# Patient Record
Sex: Male | Born: 1975 | Race: White | Hispanic: No | Marital: Single
Health system: Southern US, Community
[De-identification: ages and names within clinical notes are randomized; demographics above are authoritative.]

## PROBLEM LIST (undated history)

## (undated) DIAGNOSIS — K5010 Crohn's disease of large intestine without complications: Secondary | ICD-10-CM

## (undated) HISTORY — DX: Crohn's disease of large intestine without complications: K50.10

---

## 2009-01-31 ENCOUNTER — Emergency Department: Payer: Self-pay | Admitting: Emergency Medicine

## 2009-12-07 ENCOUNTER — Emergency Department: Payer: Self-pay | Admitting: Emergency Medicine

## 2012-06-13 ENCOUNTER — Emergency Department: Payer: Self-pay | Admitting: Emergency Medicine

## 2012-08-18 ENCOUNTER — Inpatient Hospital Stay: Payer: Self-pay | Admitting: Surgery

## 2012-08-18 LAB — COMPREHENSIVE METABOLIC PANEL
Albumin: 3.8 g/dL (ref 3.4–5.0)
Bilirubin,Total: 0.3 mg/dL (ref 0.2–1.0)
Chloride: 106 mmol/L (ref 98–107)
Co2: 30 mmol/L (ref 21–32)
Creatinine: 1.09 mg/dL (ref 0.60–1.30)
EGFR (African American): >60
EGFR (Non-African Amer.): >60
Glucose: 101 mg/dL — ABNORMAL HIGH (ref 65–99)
Osmolality: 283 (ref 275–301)
SGOT(AST): 23 U/L (ref 15–37)
SGPT (ALT): 19 U/L (ref 12–78)
Sodium: 142 mmol/L (ref 136–145)
Total Protein: 7.5 g/dL (ref 6.4–8.2)

## 2012-08-18 LAB — URINALYSIS, COMPLETE
Bilirubin,UR: NEGATIVE
Glucose,UR: NEGATIVE mg/dL (ref 0–75)
Ketone: NEGATIVE
Leukocyte Esterase: NEGATIVE
Ph: 6 (ref 4.5–8.0)
RBC,UR: 3 /HPF (ref 0–5)
Squamous Epithelial: <1
WBC UR: 2 /HPF (ref 0–5)

## 2012-08-18 LAB — CBC
HCT: 47.6 % (ref 40.0–52.0)
HGB: 16.1 g/dL (ref 13.0–18.0)
MCH: 30.8 pg (ref 26.0–34.0)
MCV: 91 fL (ref 80–100)
Platelet: 311 10*3/uL (ref 150–440)
RDW: 12.6 % (ref 11.5–14.5)
WBC: 19.3 10*3/uL — ABNORMAL HIGH (ref 3.8–10.6)

## 2012-08-20 LAB — BASIC METABOLIC PANEL
Calcium, Total: 8.1 mg/dL — ABNORMAL LOW (ref 8.5–10.1)
EGFR (Non-African Amer.): >60
Glucose: 105 mg/dL — ABNORMAL HIGH (ref 65–99)
Osmolality: 281 (ref 275–301)
Potassium: 3.7 mmol/L (ref 3.5–5.1)
Sodium: 142 mmol/L (ref 136–145)

## 2012-08-20 LAB — CBC WITH DIFFERENTIAL/PLATELET
Eosinophil #: 0.1 10*3/uL (ref 0.0–0.7)
Eosinophil %: 1.3 %
HCT: 37.7 % — ABNORMAL LOW (ref 40.0–52.0)
HGB: 12.8 g/dL — ABNORMAL LOW (ref 13.0–18.0)
Lymphocyte #: 0.8 10*3/uL — ABNORMAL LOW (ref 1.0–3.6)
MCH: 31.2 pg (ref 26.0–34.0)
Monocyte #: 0.5 x10 3/mm (ref 0.2–1.0)
Monocyte %: 4.7 %
Neutrophil #: 9 10*3/uL — ABNORMAL HIGH (ref 1.4–6.5)
Neutrophil %: 86.2 %
Platelet: 242 10*3/uL (ref 150–440)
RBC: 4.11 10*6/uL — ABNORMAL LOW (ref 4.40–5.90)
WBC: 10.5 10*3/uL (ref 3.8–10.6)

## 2012-08-21 LAB — BASIC METABOLIC PANEL
Anion Gap: 6 — ABNORMAL LOW (ref 7–16)
BUN: 5 mg/dL — ABNORMAL LOW (ref 7–18)
Chloride: 104 mmol/L (ref 98–107)
Creatinine: 1.08 mg/dL (ref 0.60–1.30)
Potassium: 3.6 mmol/L (ref 3.5–5.1)
Sodium: 141 mmol/L (ref 136–145)

## 2012-08-21 LAB — CBC WITH DIFFERENTIAL/PLATELET
Basophil #: 0 10*3/uL (ref 0.0–0.1)
Eosinophil #: 0.2 10*3/uL (ref 0.0–0.7)
Eosinophil %: 2.8 %
HCT: 39.2 % — ABNORMAL LOW (ref 40.0–52.0)
Lymphocyte %: 14 %
MCH: 30.3 pg (ref 26.0–34.0)
MCHC: 33.5 g/dL (ref 32.0–36.0)
MCV: 91 fL (ref 80–100)
Neutrophil #: 5.8 10*3/uL (ref 1.4–6.5)
Neutrophil %: 77.8 %
RDW: 12.7 % (ref 11.5–14.5)
WBC: 7.4 10*3/uL (ref 3.8–10.6)

## 2012-08-22 LAB — CBC WITH DIFFERENTIAL/PLATELET
Basophil %: 0.3 %
Eosinophil %: 2.5 %
Lymphocyte %: 11.2 %
Monocyte #: 0.5 x10 3/mm (ref 0.2–1.0)
Monocyte %: 4.6 %
Neutrophil %: 81.4 %
Platelet: 319 10*3/uL (ref 150–440)
RBC: 4.86 10*6/uL (ref 4.40–5.90)
WBC: 10.9 10*3/uL — ABNORMAL HIGH (ref 3.8–10.6)

## 2012-08-22 LAB — BASIC METABOLIC PANEL
Anion Gap: 9 (ref 7–16)
Co2: 27 mmol/L (ref 21–32)
Creatinine: 0.85 mg/dL (ref 0.60–1.30)
EGFR (African American): >60
Potassium: 3.5 mmol/L (ref 3.5–5.1)
Sodium: 141 mmol/L (ref 136–145)

## 2012-08-23 LAB — WOUND CULTURE

## 2012-08-24 LAB — CBC WITH DIFFERENTIAL/PLATELET
Basophil #: 0 10*3/uL (ref 0.0–0.1)
Basophil %: 0.4 %
Eosinophil %: 4.9 %
HCT: 34.4 % — ABNORMAL LOW (ref 40.0–52.0)
Lymphocyte #: 1.1 10*3/uL (ref 1.0–3.6)
MCH: 31 pg (ref 26.0–34.0)
MCV: 90 fL (ref 80–100)
Monocyte #: 0.4 x10 3/mm (ref 0.2–1.0)
Monocyte %: 5.9 %
Neutrophil #: 4.9 10*3/uL (ref 1.4–6.5)
Platelet: 273 10*3/uL (ref 150–440)
RBC: 3.81 10*6/uL — ABNORMAL LOW (ref 4.40–5.90)
WBC: 6.7 10*3/uL (ref 3.8–10.6)

## 2012-08-24 LAB — COMPREHENSIVE METABOLIC PANEL
Albumin: 2.4 g/dL — ABNORMAL LOW (ref 3.4–5.0)
Anion Gap: 7 (ref 7–16)
BUN: 8 mg/dL (ref 7–18)
Bilirubin,Total: 0.3 mg/dL (ref 0.2–1.0)
Co2: 26 mmol/L (ref 21–32)
EGFR (Non-African Amer.): >60
Osmolality: 281 (ref 275–301)
Potassium: 3.8 mmol/L (ref 3.5–5.1)
Sodium: 142 mmol/L (ref 136–145)
Total Protein: 5.3 g/dL — ABNORMAL LOW (ref 6.4–8.2)

## 2012-08-25 LAB — CULTURE, BLOOD (SINGLE)

## 2013-11-12 IMAGING — CT CT ABD-PELV W/ CM
1 of 2 series · 15 of 32 positions shown, 19 images · IV contrast (isovue)
Comparison: none

REASON FOR EXAM: (1) RLQ PAIN; (2) RLQ PAIN, EVAL APPENDICITIS;    NOTE:
Nursing to Give Oral CT
COMMENTS:   May transport without cardiac monitor

PROCEDURE:     CT  - CT ABDOMEN / PELVIS  W  - August 18, 2012 [DATE]
RESULT:     Comparison:  None
TECHNIQUE: Multiple axial images of the abdomen and pelvis were performed
from the lung bases to the pubic symphysis, with p.o. contrast and with 100
mL of Isovue 300 intravenous contrast.

[Series 2: 3mm soft tissue · axial · 0.76mm/px · z∈[-1124,-668]mm · 15 of 166 slices shown, 19 images]
[im 7/166  soft-tissue]
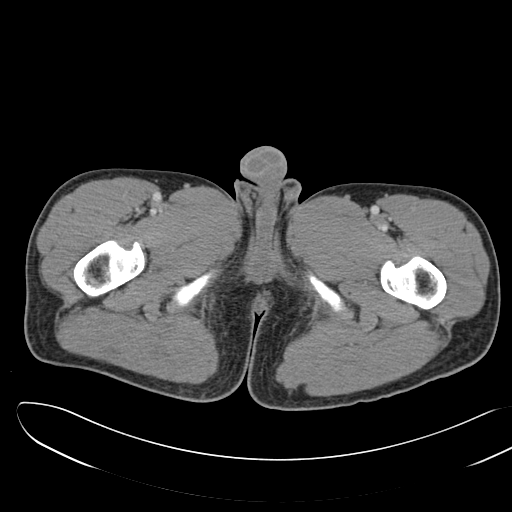
[im 7/166  bone]
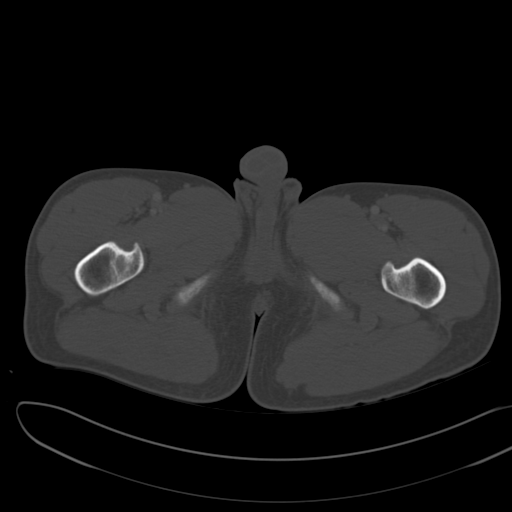
[im 20/166  soft-tissue]
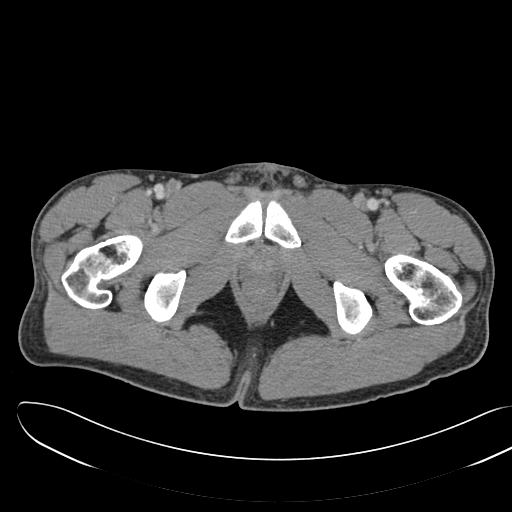
[im 32/166  soft-tissue]
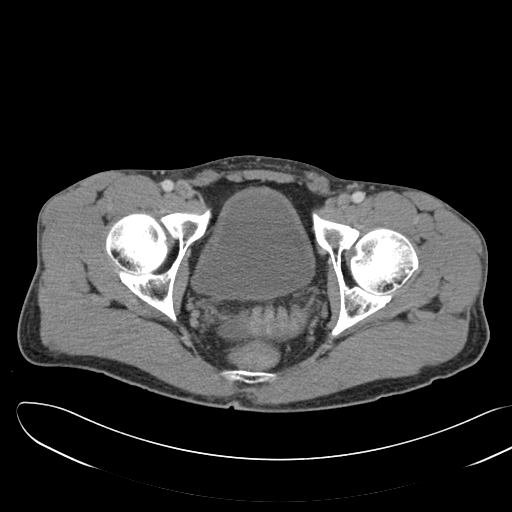
[im 45/166  soft-tissue]
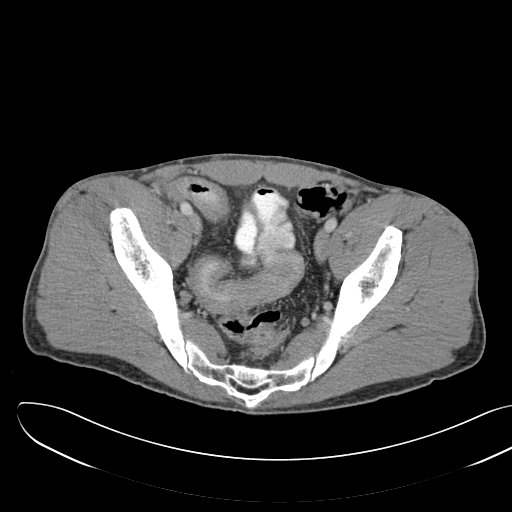
[im 58/166  soft-tissue]
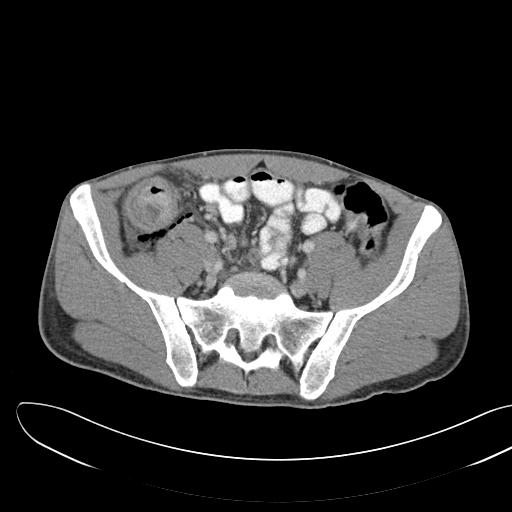
[im 70/166  soft-tissue]
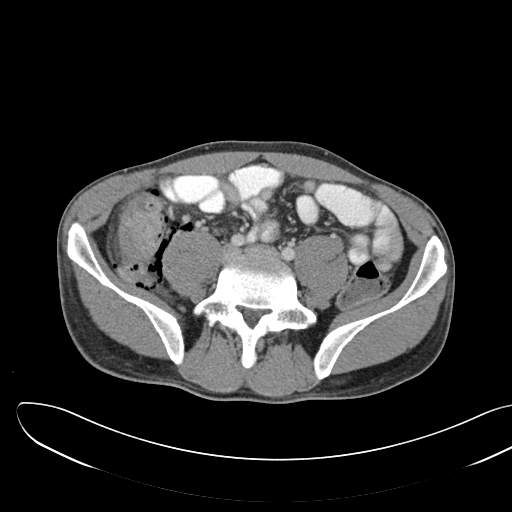
[im 83/166  soft-tissue]
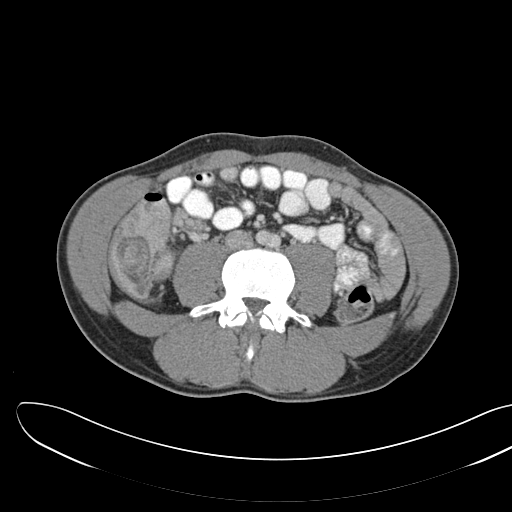
[im 96/166  soft-tissue]
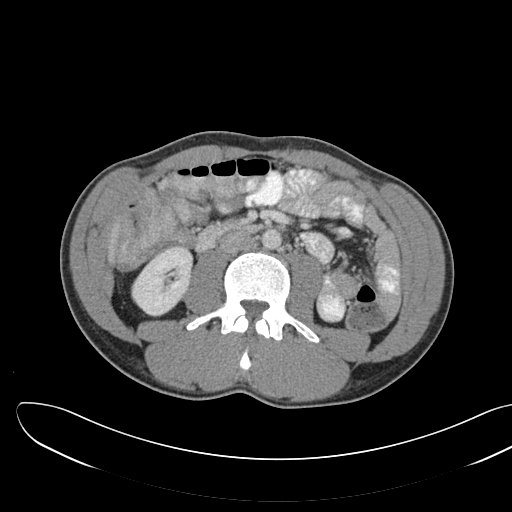
[im 108/166  soft-tissue]
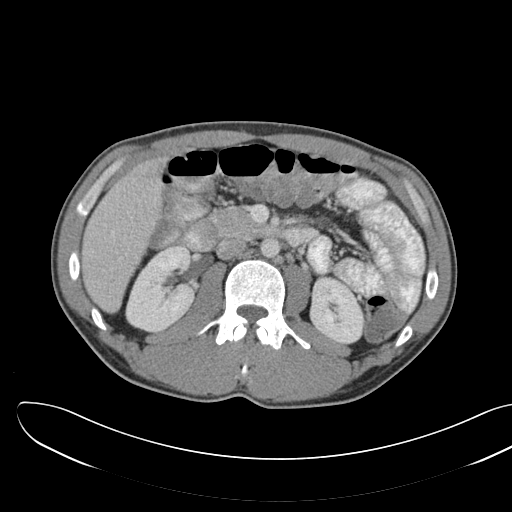
[im 108/166  bone]
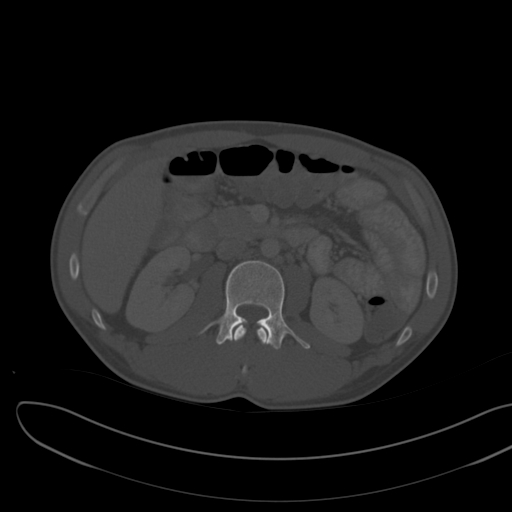
[im 121/166  soft-tissue]
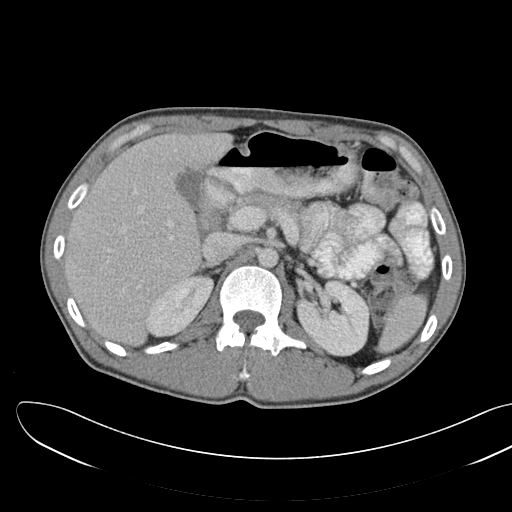
[im 134/166  soft-tissue]
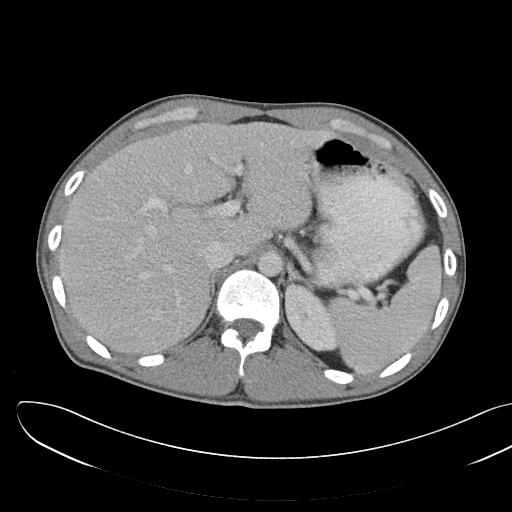
[im 140/166  lung]
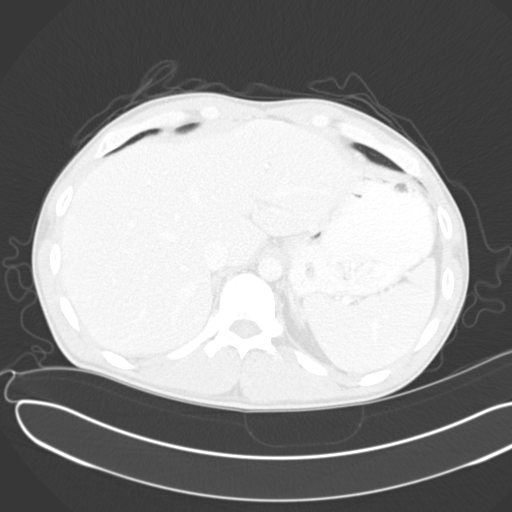
[im 146/166  soft-tissue]
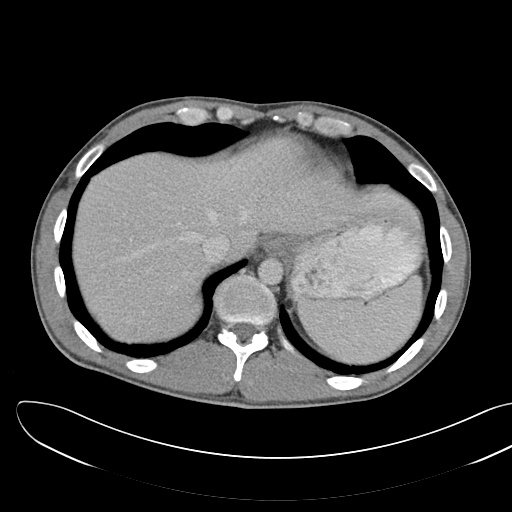
[im 146/166  lung]
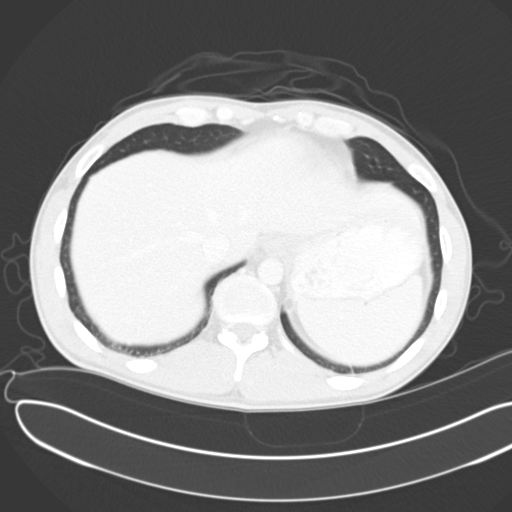
[im 153/166  lung]
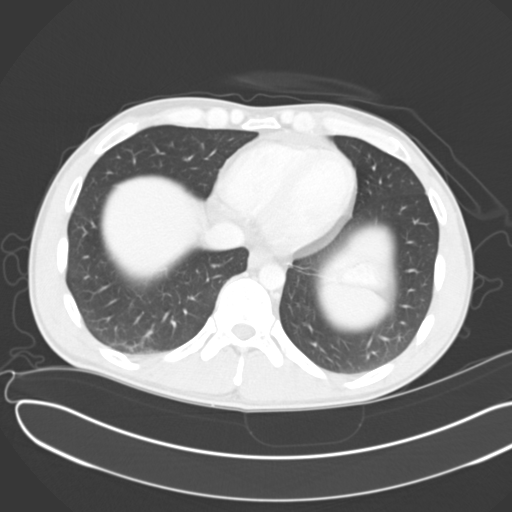
[im 159/166  soft-tissue]
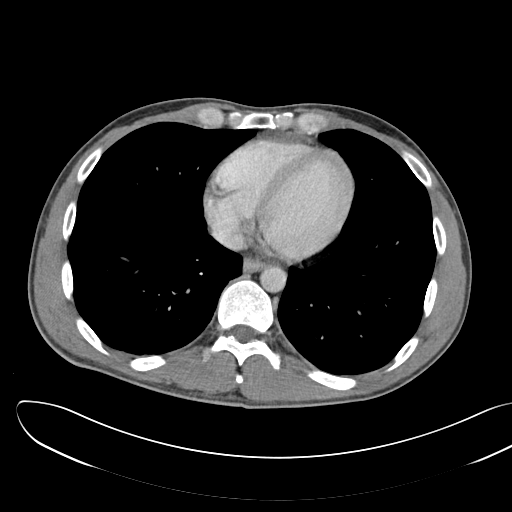
[im 159/166  lung]
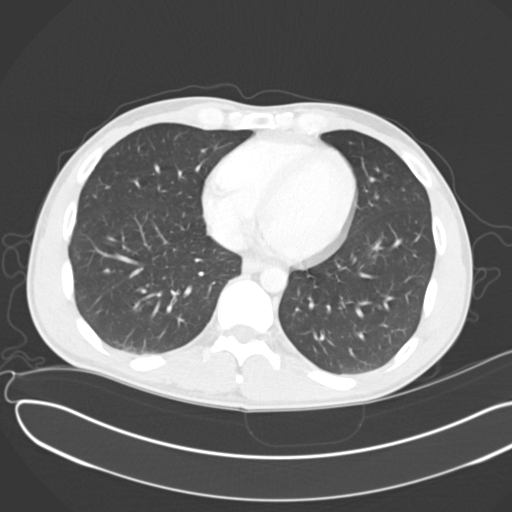

[15 of 32 positions shown; findings below may reference images not displayed]

FINDINGS: Mild basilar opacities likely represent atelectasis.

The liver, gallbladder, adrenals, spleen, and pancreas are unremarkable. The
kidneys enhance normally.

There is a trace amount of free fluid in the pelvis. There is marked bowel
wall thickening of the terminal ileum. There is some bowel thickening of the
cecum and ascending colon as well. There are multiple foci of extraluminal
air adjacent to this bowel wall thickening. There is a small diverticulum
versus contained perforation extending from the terminal ileum as seen on
image 105. A drainable fluid collection is not identified.

No aggressive lytic or sclerotic osseous lesions are identified.
IMPRESSION: 1. There is marked bowel wall thickening of the terminal ileum as well as
bowel thickening of the ascending colon and cecum. The findings are likely
infectious or inflammatory, such as Crohn's disease. There is adjacent free
intraperitoneal air, consistent with bowel perforation. There is a small
diverticulum versus contained perforation extending medially from the
terminal ileum.
2. The appendix cannot be identified and therefore, appendicitis cannot be
excluded. However, the findings in the right lower quadrant appear to be
centered about the wall thickening of the terminal ileum.

This was discussed with Dr. Calandra Jim at 2220 hours 08/18/2012.

[REDACTED]

## 2013-11-19 IMAGING — CT CT ABD-PELV W/ CM
1 of 2 series · 14 of 32 positions shown, 18 images · non-contrast
Comparison: none

REASON FOR EXAM: (1) POD 7, no bowel fxn; (2) POD 7, no bowel function
COMMENTS:

PROCEDURE:     CT  - CT ABDOMEN / PELVIS  W  - August 25, 2012  [DATE]
RESULT:
TECHNIQUE: CT of the abdomen and pelvis is performed with 100 ml of
Rsovue-4II iodinated intravenous contrast with images reconstructed at 3 mm
slice thickness in the axial plane and compared to a study dated 18 August, 2012.

[Series 2: soft tissue · axial · 0.71mm/px · z∈[-1075,-655]mm · 14 of 160 slices shown, 18 images]
[im 13/160  soft-tissue]
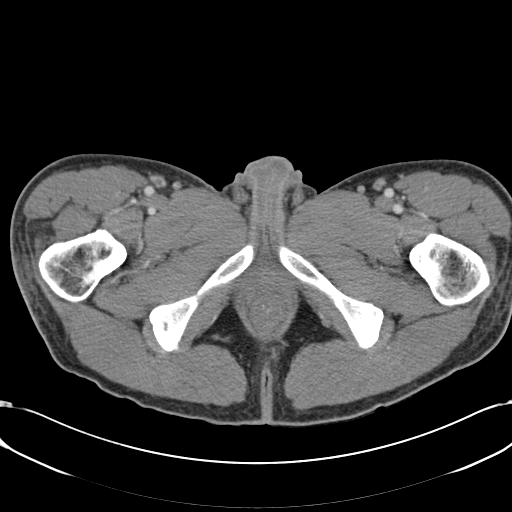
[im 13/160  bone]
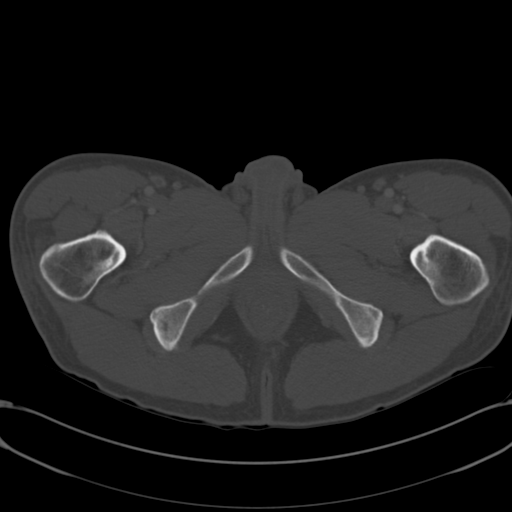
[im 25/160  soft-tissue]
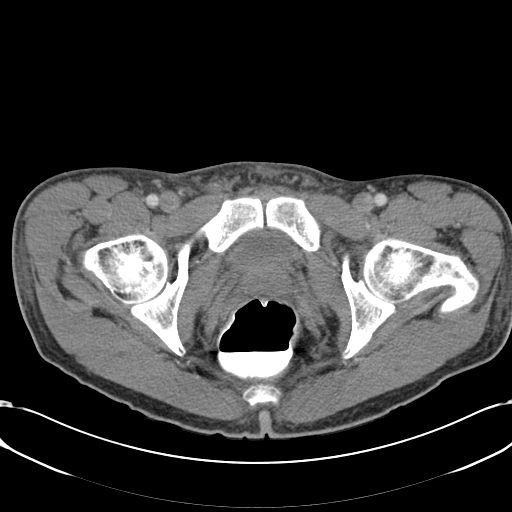
[im 37/160  soft-tissue]
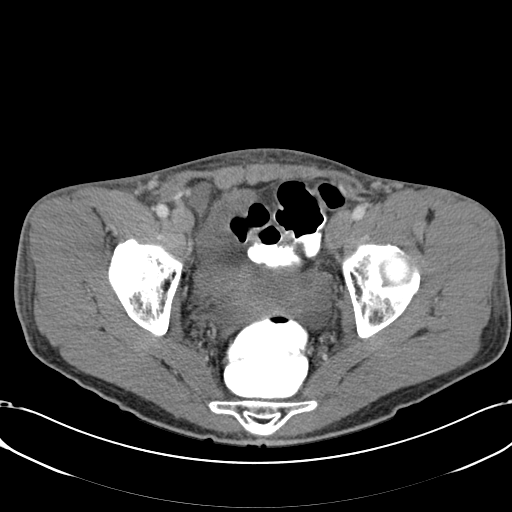
[im 49/160  soft-tissue]
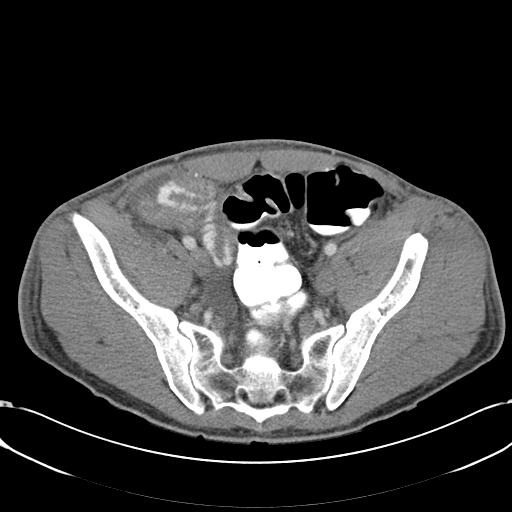
[im 62/160  soft-tissue]
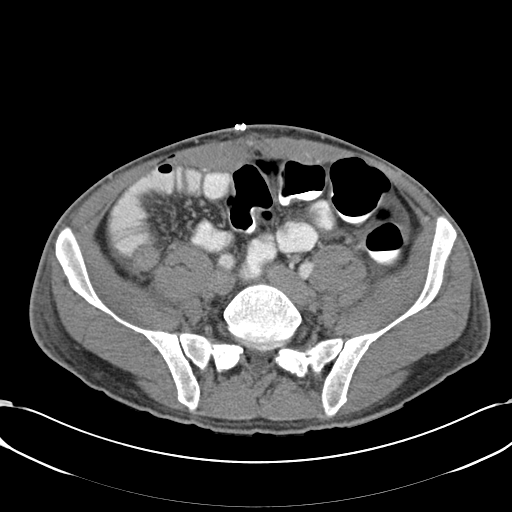
[im 74/160  soft-tissue]
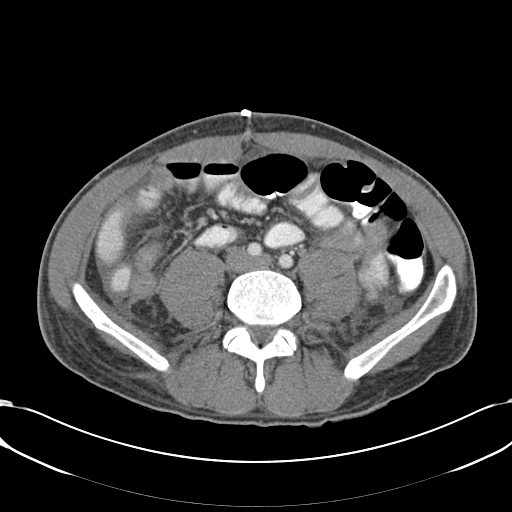
[im 86/160  soft-tissue]
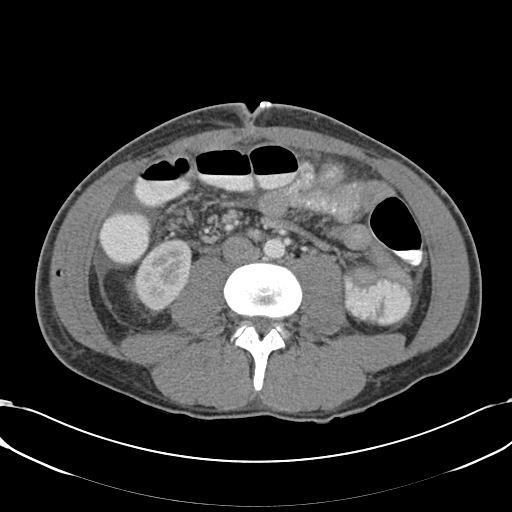
[im 98/160  soft-tissue]
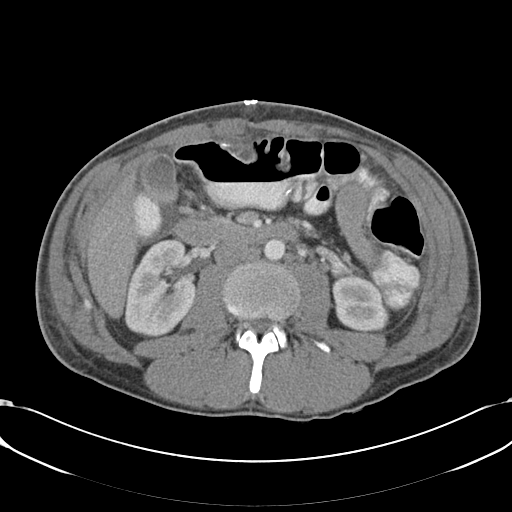
[im 111/160  soft-tissue]
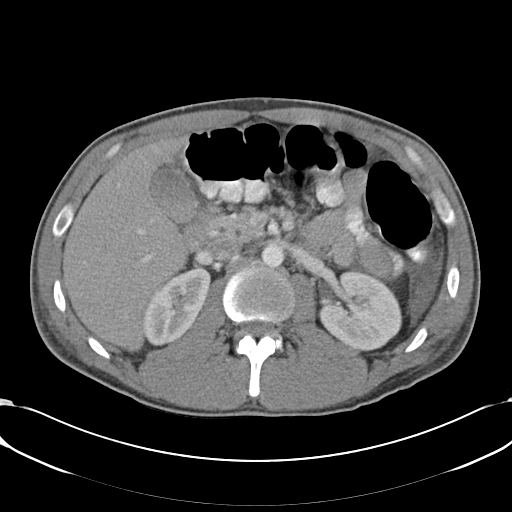
[im 111/160  bone]
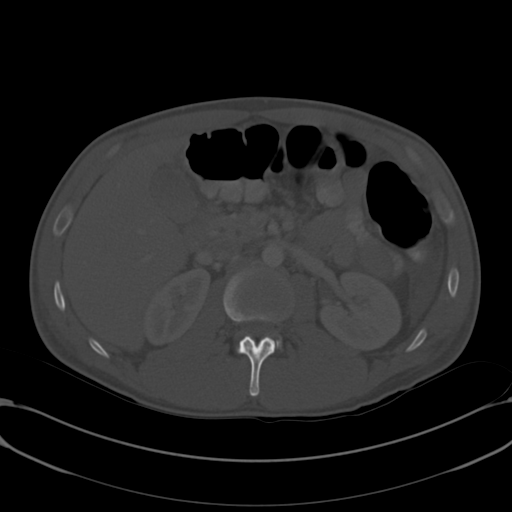
[im 123/160  soft-tissue]
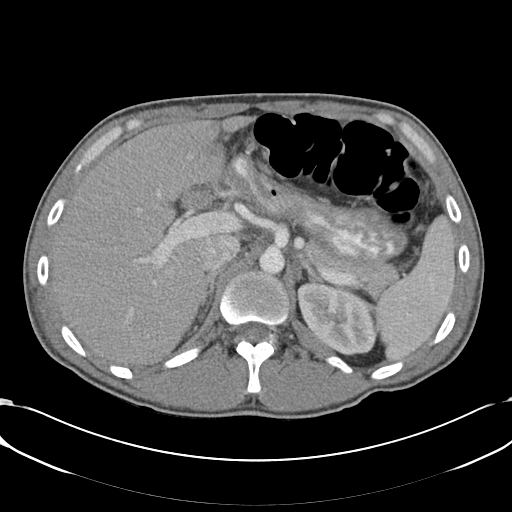
[im 135/160  soft-tissue]
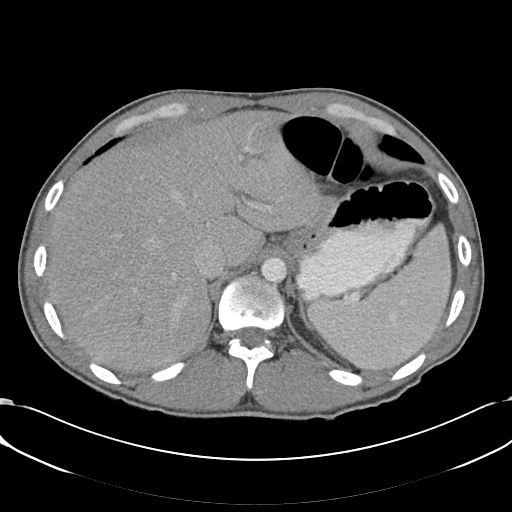
[im 135/160  lung]
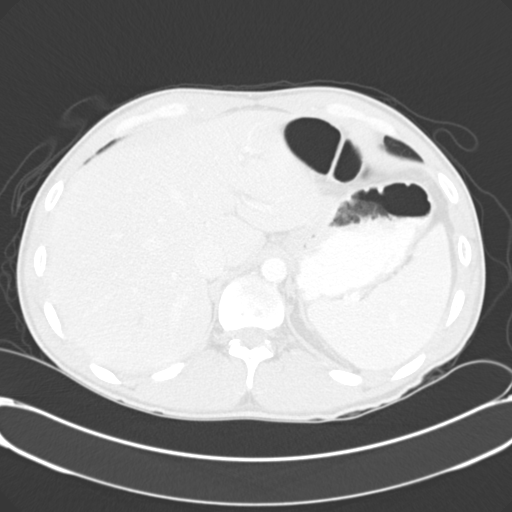
[im 141/160  lung]
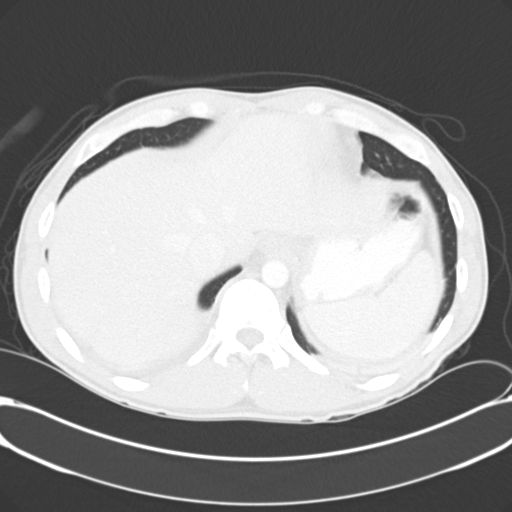
[im 147/160  soft-tissue]
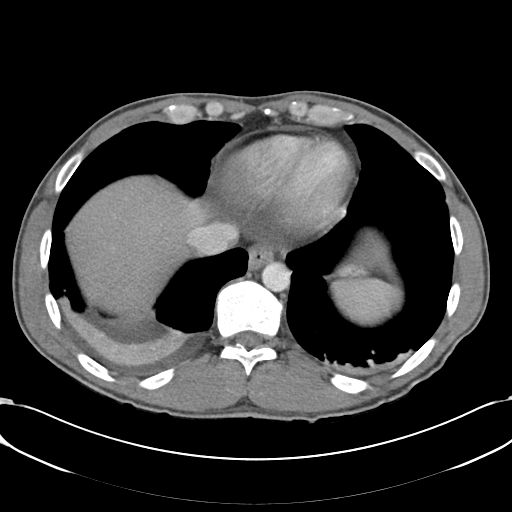
[im 147/160  lung]
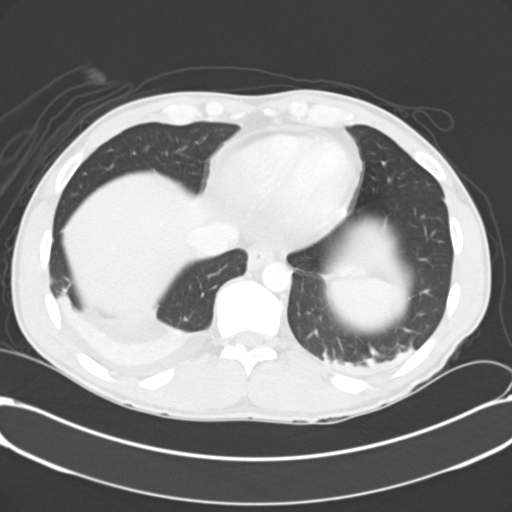
[im 153/160  lung]
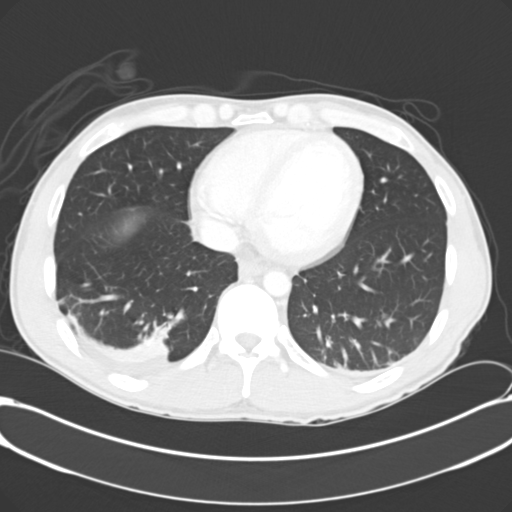

[14 of 32 positions shown; findings below may reference images not displayed]

FINDINGS: The study demonstrates development of a trace amount of right
pleural effusion since the previous study with some bilateral lower lobe
atelectasis versus infiltrate, larger on the right than the left. This is a
relatively minimal amount but does show progression since the previous study.

It appears the patient has undergone previous surgery since the previous
exam suggesting partial colectomy on the right. There are nonopacified loops
of small bowel without abnormal distention. It is difficult to assess the
wall of these loops. Oral contrast has passed into the colon and is present
in the rectum. There is no evidence of obstruction, abscess or perforation.
Some layering density in the gallbladder could represent sludge or
hyperdense bile. There is no intrahepatic biliary ductal dilation or hepatic
mass. The spleen is unremarkable. There is slight thickening of the wall of
the body of the stomach to the antrum which could be from incomplete
distention or mild gastritis. Correlate with symptoms. The aorta is normal
in caliber. The kidneys show no abnormal obstructive pattern or mass. The
adrenal glands are unremarkable. The bony structures appear within normal
limits.
IMPRESSION: 1.     Findings suggestive of interval hemicolectomy on the right without
evidence of obstruction or perforation. No abscess evident. Some unopacified
loops of small bowel are present. It is difficult to assess the wall
thickness in these loops. Oral contrast is seen in the colon to the rectum.
There may be mild thickening of the wall of the stomach or this could be
artifact secondary to poor distention.
2.     The patient has developed bibasilar areas of atelectasis or minimal
infiltrate, slightly greater on the right, with a trace, right pleural
effusion.

[REDACTED]

## 2015-02-27 NOTE — Op Note (Signed)
PATIENT NAME:  Matthew Madden, AESCHLIMAN MR#:  [id number] DATE OF BIRTH:  [birth year]  DATE OF PROCEDURE:  08/19/2012  PREOPERATIVE DIAGNOSIS: Inflammatory bowel disease right lower quadrant, possible abscess, possible perforation.   POSTOPERATIVE DIAGNOSIS: Crohn's disease abscess but no evidence of gross perforation.   SURGERY: Small bowel resection and ascending colon resection with primary anastomosis.   SURGEON: Quentin Ore, III, MD   ANESTHESIA: General.   OPERATIVE PROCEDURE: With the patient in the supine position after the induction of appropriate general anesthesia, the patient's abdomen was prepped with ChloraPrep and draped with sterile towels. A midline incision was made from just above the umbilicus to just below the umbilicus and carried down through the subcutaneous tissue using Bovie electrocautery. Midline fascia was identified and opened the length of the skin incision as was the peritoneum. Some purulent fluid was encountered immediately. The omentum appeared to be stuck in the right lower quadrant. The fluid was cultured. The bowel was mobilized and it was clearly an inflammatory bowel situation with creeping fat over the very thickened edematous distal ileum. The disease appeared to include the beginning of the colon. The appendix appeared to be normal. There was a large amount of purulent fluid in the right lower quadrant but I could not demonstrate a perforation. I carefully examined the bowel from the distal two feet of the small bowel to the mid transverse colon but did not identify any evidence of an obvious perforation. There was a diverticulum about 8 inches from the ileocecal valve. I felt that this area needed to be resected with only inflammatory change and probable Crohn's disease. The right colon was mobilized from the proximal transverse colon around the cecum and appendix. Small bowel was elevated into the incision and run from the ligament of Treitz. There did not appear to  be any other evidence of disease. The left lower quadrant was examined and did not appear to be any evidence of any diverticulitis or inflammatory change in the transverse or descending colon. The stomach was uninvolved. The liver and gallbladder appeared normal. An area was chosen on the small bowel approximately 10 cm proximal to the most distal area of inflammatory change. The bowel was divided with a GIA-75 stapling device. The ascending colon was divided at the hepatic flexure in a similar manner. The mesentery was taken down with the LigaSure apparatus providing a large remnant of mesentery to provide good blood flow to both the proximal and distal bowel. The bowel was passed off the table and opened. At the time of the evaluation what appeared to be about a 3 cm margin on the small bowel so I removed another 7 cm of small bowel with another division with the GIA stapling device and LigaSure apparatus dividing the mesentery. Two loops of bowel were placed side by side and small enterotomies were made in each loop of bowel. Another load for the GIA-75 stapling device was utilized to provide a side-to-side anastomosis. The anastomosis appeared to be satisfactory. The enterotomy was closed with a single application of a TX-60 device carrying a blue load. The staple line was oversewn with 3-0 silk in a seromuscular fashion. The mesenteric defect was closed with a running suture of 0 Vicryl. Bowel contents were returned to their anatomic position. The abdomen was irrigated with 2 liters of warm saline solution. Fascia was closed with running 0 Maxon suture from each end, tied in the middle, and the skin was stapled closed. Sterile dressing was applied.  The patient was returned to the recovery room having tolerated the procedure well. Sponge, instrument, and needle counts were correct x2 in the operating room.    ____________________________ Quentin Ore III, MD rle:drc D: 08/19/2012 01:42:02  ET T: 08/19/2012 07:16:33 ET JOB#: [id number]  cc: Quentin Ore III, MD, <Dictator> Quentin Ore MD ELECTRONICALLY SIGNED 08/20/2012 22:52

## 2015-02-27 NOTE — H&P (Signed)
PATIENT NAME:  Matthew Madden, Matthew Madden MR#:  [id number] DATE OF BIRTH:  03/12/1976  DATE OF ADMISSION:  08/18/2012  PRIMARY CARE PHYSICIAN: None ADMITTING PHYSICIAN: Dr. Michela Pitcher  CHIEF COMPLAINT: Abdominal pain, nausea and vomiting.   BRIEF HISTORY: Matthew Madden is a 39 year old gentleman with the sudden onset of abdominal pain early this morning. He had been eating and noted abdominal discomfort which he characterized as gas pain. Tried to have a bowel movement without any improvement in his symptoms and over the course of the next several hours the pain became much more severe. Began to have profound nausea and vomiting. His mother brought him to the hospital for further evaluation. He denies any similar previous problems in the past. He has lost weight recently. Denies any other GI symptoms with no diarrhea or constipation. He has had no previous GI work-up. Denies any history of hepatitis, pancreatitis, peptic ulcer disease, gallbladder disease, or diverticulitis. He does have history of yellow jaundice as a child. He has had bilateral inguinal hernia repairs as a child. He was a premature birth.   Work-up in the Emergency Room revealed a markedly elevated white blood cell count of 19,000. CT scan was performed with contrast which revealed free air in the right lower quadrant, right lower quadrant abscess, thickened small bowel suggestive of possible inflammatory bowel disease. Appendicitis could not be ruled out. The surgical service was consulted.   He denies any history of cardiac disease, hypertension, or diabetes. He has a history of thyroid disease.   MEDICATIONS: He takes no medications regularly.   ALLERGIES: He has no medical allergies.   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 He works as a Education administrator.   FAMILY HISTORY: Noncontributory.   REVIEW OF SYSTEMS: 10 point review of systems is carried out and is not significant for anything other than the symptoms noted  above.   PHYSICAL EXAMINATION:  GENERAL: Temperature 98.1, blood pressure 116/60, heart rate 98 and regular, oxygen saturation 98%. Pain is an 8 on a scale of 10.    HEENT: Sweating. He has no scleral icterus. No facial deformities. No pupillary abnormality.   NECK: Supple. No tenderness. No adenopathy. Midline trachea.   CHEST: Clear with no adventitious sounds. Normal pulmonary excursion.   CARDIAC: Normal sinus rhythm and no murmurs or gallops.   ABDOMEN: His abdomen is tense, flat with marked right lower quadrant point tenderness and rebound. He has referred rebound. He clearly has an acute abdominal situation. He has no inguinal hernias. Has good distal pulses. No deformities and normal range of motion.   PSYCHIATRIC: Normal affect and orientation.   ASSESSMENT AND PLAN: I have independently reviewed his CT scan. He does have thickened small intestine in right lower quadrant. The appendix is not visible. There are inflammatory changes around the right lower quadrant and several foci of free air.   IMPRESSION: In this setting I think this gentleman has a small bowel perforation, possible Meckel's diverticulitis, possible inflammatory bowel disease, possible appendicitis. With the free air abscess, his 19,000 white count and his systemic inflammatory response syndrome with an acute abdomen on examination I think he needs urgent surgical exploration. This plan has been discussed with the patient in detail and he is in agreement.    ____________________________ Carmie End, MD rle:cms D: 08/18/2012 23:26:59 ET T: 08/19/2012 06:05:16 ET JOB#: [id number]  cc: Quentin Ore III, MD, <Dictator> Quentin Ore MD ELECTRONICALLY SIGNED 08/20/2012 22:51

## 2015-02-27 NOTE — Discharge Summary (Signed)
PATIENT NAME:  RICHERD, VANDUZER MR#:  [id number] DATE OF BIRTH:  [birth year]  DATE OF ADMISSION:  08/18/2012 DATE OF DISCHARGE:  08/26/2012  BRIEF HISTORY/HOSPITAL COURSE: Mr. Matthew Madden is a 39 year old gentleman admitted through the Emergency Room with signs and symptoms consistent with an acute abdomen. He was felt to have probable right lower quadrant inflammatory bowel disease with possible abscess and perforation. He had an elevated white blood cell count and a surgical abdomen. He was taken urgently to surgery that evening where he underwent exploratory laparotomy. He underwent resection of a segment of small bowel which appeared to be suspicious for Crohn's disease. He had very slow return of bowel function. He was seen by the GI service who assisted in his postoperative evaluation. His symptoms resolved and his wound remained stable. He was discharged home on 08/26/2012 to be followed in the office in 7 to 10 days' time. Bathing, activity, and driving instructions were given to the patient. His discharge medications include Percocet 1 to 2 tablets every four hours p.r.n. pain. He will be followed in the office and referred to a gastroenterologist for possible post-hospital therapy. This plan has been outlined to the patient and he is in agreement.  ____________________________ Carmie End, MD rle:slb D: 09/06/2012 17:39:24 ET T: 09/07/2012 12:07:52 ET JOB#: [id number]  cc: Carmie End, MD, <Dictator> Scot Jun, MD Quentin Ore MD ELECTRONICALLY SIGNED 09/10/2012 18:14

## 2022-06-16 ENCOUNTER — Encounter: Payer: Self-pay | Admitting: Emergency Medicine

## 2022-06-16 DIAGNOSIS — L03113 Cellulitis of right upper limb: Secondary | ICD-10-CM | POA: Diagnosis present

## 2022-06-16 DIAGNOSIS — Z79899 Other long term (current) drug therapy: Secondary | ICD-10-CM

## 2022-06-16 DIAGNOSIS — M65841 Other synovitis and tenosynovitis, right hand: Secondary | ICD-10-CM | POA: Diagnosis present

## 2022-06-16 DIAGNOSIS — Z23 Encounter for immunization: Secondary | ICD-10-CM

## 2022-06-16 DIAGNOSIS — S61242A Puncture wound with foreign body of right middle finger without damage to nail, initial encounter: Principal | ICD-10-CM | POA: Diagnosis present

## 2022-06-16 DIAGNOSIS — W11XXXA Fall on and from ladder, initial encounter: Secondary | ICD-10-CM | POA: Diagnosis present

## 2022-06-16 DIAGNOSIS — D84821 Immunodeficiency due to drugs: Secondary | ICD-10-CM | POA: Diagnosis present

## 2022-06-16 DIAGNOSIS — Z79620 Long term (current) use of immunosuppressive biologic: Secondary | ICD-10-CM

## 2022-06-16 DIAGNOSIS — B9561 Methicillin susceptible Staphylococcus aureus infection as the cause of diseases classified elsewhere: Secondary | ICD-10-CM | POA: Diagnosis present

## 2022-06-16 DIAGNOSIS — K5010 Crohn's disease of large intestine without complications: Secondary | ICD-10-CM | POA: Diagnosis present

## 2022-06-16 DIAGNOSIS — L03011 Cellulitis of right finger: Secondary | ICD-10-CM | POA: Diagnosis present

## 2022-06-16 DIAGNOSIS — Y92009 Unspecified place in unspecified non-institutional (private) residence as the place of occurrence of the external cause: Secondary | ICD-10-CM

## 2022-06-16 NOTE — ED Triage Notes (Signed)
Pt presents via POV with complaints of right middle finger injury. Pt notes pulling a piece of wood out of his finger and he has visible swelling with drainage. Also, the patient endorses lower back pain following a fall - hx of chronic back pain. Denies fevers, CP, or SOB.

## 2022-06-17 ENCOUNTER — Emergency Department: Payer: Self-pay

## 2022-06-17 ENCOUNTER — Encounter: Payer: Self-pay | Admitting: Internal Medicine

## 2022-06-17 ENCOUNTER — Other Ambulatory Visit: Payer: Self-pay

## 2022-06-17 ENCOUNTER — Inpatient Hospital Stay
Admission: EM | Admit: 2022-06-17 | Discharge: 2022-06-20 | DRG: 914 | Disposition: A | Discharge status: Home / Self Care | Payer: Self-pay | Attending: Osteopathic Medicine | Admitting: Osteopathic Medicine

## 2022-06-17 DIAGNOSIS — L089 Local infection of the skin and subcutaneous tissue, unspecified: Secondary | ICD-10-CM

## 2022-06-17 DIAGNOSIS — K5090 Crohn's disease, unspecified, without complications: Secondary | ICD-10-CM

## 2022-06-17 DIAGNOSIS — W19XXXA Unspecified fall, initial encounter: Secondary | ICD-10-CM

## 2022-06-17 DIAGNOSIS — Z79899 Other long term (current) drug therapy: Secondary | ICD-10-CM

## 2022-06-17 DIAGNOSIS — M545 Low back pain, unspecified: Secondary | ICD-10-CM

## 2022-06-17 DIAGNOSIS — L03113 Cellulitis of right upper limb: Secondary | ICD-10-CM | POA: Diagnosis present

## 2022-06-17 DIAGNOSIS — D84821 Immunodeficiency due to drugs: Secondary | ICD-10-CM

## 2022-06-17 LAB — CBC WITH DIFFERENTIAL/PLATELET
Abs Immature Granulocytes: 0.02 10*3/uL (ref 0.00–0.07)
Basophils Absolute: 0 10*3/uL (ref 0.0–0.1)
Basophils Relative: 0 %
Eosinophils Absolute: 0.2 10*3/uL (ref 0.0–0.5)
Eosinophils Relative: 2 %
HCT: 38.4 % — ABNORMAL LOW (ref 39.0–52.0)
Hemoglobin: 12.6 g/dL — ABNORMAL LOW (ref 13.0–17.0)
Immature Granulocytes: 0 %
Lymphocytes Relative: 25 %
Lymphs Abs: 2.8 10*3/uL (ref 0.7–4.0)
MCH: 31.8 pg (ref 26.0–34.0)
MCHC: 32.8 g/dL (ref 30.0–36.0)
MCV: 97 fL (ref 80.0–100.0)
Monocytes Absolute: 0.7 10*3/uL (ref 0.1–1.0)
Monocytes Relative: 6 %
Neutro Abs: 7.2 10*3/uL (ref 1.7–7.7)
Neutrophils Relative %: 67 %
Platelets: 248 10*3/uL (ref 150–400)
RBC: 3.96 MIL/uL — ABNORMAL LOW (ref 4.22–5.81)
RDW: 12.4 % (ref 11.5–15.5)
WBC: 10.9 10*3/uL — ABNORMAL HIGH (ref 4.0–10.5)
nRBC: 0 % (ref 0.0–0.2)

## 2022-06-17 LAB — CREATININE, SERUM
Creatinine, Ser: 0.69 mg/dL (ref 0.61–1.24)
GFR, Estimated: >60 mL/min (ref >60)

## 2022-06-17 LAB — COMPREHENSIVE METABOLIC PANEL
ALT: 12 U/L (ref 0–44)
AST: 22 U/L (ref 15–41)
Albumin: 4.1 g/dL (ref 3.5–5.0)
Alkaline Phosphatase: 63 U/L (ref 38–126)
Anion gap: 9 (ref 5–15)
BUN: 16 mg/dL (ref 6–20)
CO2: 23 mmol/L (ref 22–32)
Calcium: 8.9 mg/dL (ref 8.9–10.3)
Chloride: 106 mmol/L (ref 98–111)
Creatinine, Ser: 0.95 mg/dL (ref 0.61–1.24)
GFR, Estimated: >60 mL/min (ref >60)
Glucose, Bld: 139 mg/dL — ABNORMAL HIGH (ref 70–99)
Potassium: 3.2 mmol/L — ABNORMAL LOW (ref 3.5–5.1)
Sodium: 138 mmol/L (ref 135–145)
Total Bilirubin: 0.5 mg/dL (ref 0.3–1.2)
Total Protein: 7.2 g/dL (ref 6.5–8.1)

## 2022-06-17 LAB — LACTIC ACID, PLASMA: Lactic Acid, Venous: 1.9 mmol/L (ref 0.5–1.9)

## 2022-06-17 LAB — MAGNESIUM: Magnesium: 2.1 mg/dL (ref 1.7–2.4)

## 2022-06-17 LAB — HIV ANTIBODY (ROUTINE TESTING W REFLEX): HIV Screen 4th Generation wRfx: NONREACTIVE

## 2022-06-17 MED ORDER — HYDROCODONE-ACETAMINOPHEN 5-325 MG PO TABS
1.0000 | ORAL_TABLET | ORAL | Status: DC | PRN
  Administered 2022-06-17 – 2022-06-18 (×4): 2 via ORAL
  Administered 2022-06-18: 1 via ORAL
  Administered 2022-06-18 – 2022-06-19 (×2): 2 via ORAL
  Administered 2022-06-19: 1 via ORAL
  Administered 2022-06-19: 2 via ORAL
  Administered 2022-06-19: 1 via ORAL
  Administered 2022-06-19 – 2022-06-20 (×4): 2 via ORAL
  Filled 2022-06-17: qty 2
  Filled 2022-06-17: qty 1
  Filled 2022-06-17: qty 2
  Filled 2022-06-17: qty 1
  Filled 2022-06-17 (×8): qty 2
  Filled 2022-06-17: qty 1
  Filled 2022-06-17: qty 2

## 2022-06-17 MED ORDER — VANCOMYCIN HCL 1750 MG/350ML IV SOLN
1750.0000 mg | Freq: Once | INTRAVENOUS | Status: AC
  Administered 2022-06-17: 1750 mg via INTRAVENOUS
  Filled 2022-06-17: qty 350

## 2022-06-17 MED ORDER — ACETAMINOPHEN 325 MG PO TABS: 650.0000 mg | ORAL_TABLET | Freq: Four times a day (QID) | ORAL | Status: DC | PRN

## 2022-06-17 MED ORDER — HYDROCODONE-ACETAMINOPHEN 5-325 MG PO TABS: 1.0000 | ORAL_TABLET | ORAL | Status: DC | PRN

## 2022-06-17 MED ORDER — TETANUS-DIPHTH-ACELL PERTUSSIS 5-2.5-18.5 LF-MCG/0.5 IM SUSY
0.5000 mL | PREFILLED_SYRINGE | Freq: Once | INTRAMUSCULAR | Status: AC
  Administered 2022-06-17: 0.5 mL via INTRAMUSCULAR
  Filled 2022-06-17: qty 0.5

## 2022-06-17 MED ORDER — ONDANSETRON HCL 4 MG PO TABS: 4.0000 mg | ORAL_TABLET | Freq: Four times a day (QID) | ORAL | Status: DC | PRN

## 2022-06-17 MED ORDER — OXYCODONE-ACETAMINOPHEN 5-325 MG PO TABS
1.0000 | ORAL_TABLET | Freq: Once | ORAL | Status: AC
  Administered 2022-06-17: 1 via ORAL
  Filled 2022-06-17: qty 1

## 2022-06-17 MED ORDER — ONDANSETRON HCL 4 MG/2ML IJ SOLN
4.0000 mg | Freq: Once | INTRAMUSCULAR | Status: AC
  Administered 2022-06-17: 4 mg via INTRAVENOUS
  Filled 2022-06-17: qty 2

## 2022-06-17 MED ORDER — MORPHINE SULFATE (PF) 2 MG/ML IV SOLN
2.0000 mg | INTRAVENOUS | Status: DC | PRN
  Administered 2022-06-17 – 2022-06-18 (×5): 2 mg via INTRAVENOUS
  Filled 2022-06-17 (×6): qty 1

## 2022-06-17 MED ORDER — SODIUM CHLORIDE 0.9 % IV SOLN: INTRAVENOUS | Status: DC

## 2022-06-17 MED ORDER — SODIUM CHLORIDE 0.9 % IV SOLN
1.0000 g | INTRAVENOUS | Status: DC
  Administered 2022-06-18 – 2022-06-19 (×2): 1 g via INTRAVENOUS
  Filled 2022-06-17 (×2): qty 1

## 2022-06-17 MED ORDER — VANCOMYCIN HCL 1250 MG/250ML IV SOLN
1250.0000 mg | Freq: Two times a day (BID) | INTRAVENOUS | Status: DC
  Administered 2022-06-17 – 2022-06-19 (×4): 1250 mg via INTRAVENOUS
  Filled 2022-06-17 (×5): qty 250

## 2022-06-17 MED ORDER — VANCOMYCIN HCL IN DEXTROSE 1-5 GM/200ML-% IV SOLN: 1000.0000 mg | Freq: Once | INTRAVENOUS | Status: DC

## 2022-06-17 MED ORDER — KETOROLAC TROMETHAMINE 30 MG/ML IJ SOLN
30.0000 mg | Freq: Four times a day (QID) | INTRAMUSCULAR | Status: AC | PRN
  Administered 2022-06-18: 30 mg via INTRAVENOUS
  Filled 2022-06-17: qty 1

## 2022-06-17 MED ORDER — SODIUM CHLORIDE 0.9 % IV SOLN
2.0000 g | Freq: Once | INTRAVENOUS | Status: AC
  Administered 2022-06-17: 2 g via INTRAVENOUS
  Filled 2022-06-17: qty 20

## 2022-06-17 MED ORDER — MORPHINE SULFATE (PF) 4 MG/ML IV SOLN
4.0000 mg | Freq: Once | INTRAVENOUS | Status: AC
  Administered 2022-06-17: 4 mg via INTRAVENOUS
  Filled 2022-06-17: qty 1

## 2022-06-17 MED ORDER — ACETAMINOPHEN 650 MG RE SUPP: 650.0000 mg | Freq: Four times a day (QID) | RECTAL | Status: DC | PRN

## 2022-06-17 MED ORDER — ONDANSETRON HCL 4 MG/2ML IJ SOLN: 4.0000 mg | Freq: Four times a day (QID) | INTRAMUSCULAR | Status: DC | PRN

## 2022-06-17 MED ORDER — LIDOCAINE HCL (PF) 1 % IJ SOLN
5.0000 mL | Freq: Once | INTRAMUSCULAR | Status: AC
  Administered 2022-06-17: 5 mL via INTRADERMAL
  Filled 2022-06-17: qty 5

## 2022-06-17 MED ORDER — POTASSIUM CHLORIDE CRYS ER 20 MEQ PO TBCR
40.0000 meq | EXTENDED_RELEASE_TABLET | Freq: Once | ORAL | Status: AC
  Administered 2022-06-17: 40 meq via ORAL
  Filled 2022-06-17: qty 2

## 2022-06-17 MED ORDER — ENOXAPARIN SODIUM 40 MG/0.4ML IJ SOSY
40.0000 mg | PREFILLED_SYRINGE | INTRAMUSCULAR | Status: DC
Start: 2022-06-17 — End: 2022-06-20
  Administered 2022-06-17 – 2022-06-20 (×4): 40 mg via SUBCUTANEOUS
  Filled 2022-06-17 (×4): qty 0.4

## 2022-06-17 MED ORDER — MORPHINE SULFATE (PF) 2 MG/ML IV SOLN: 2.0000 mg | INTRAVENOUS | Status: DC | PRN

## 2022-06-17 NOTE — ED Provider Notes (Signed)
[facility] Provider Note    Event Date/Time   First MD Initiated Contact with Patient 06/17/22 [phone]     (approximate)   History   Hand Injury and Back Pain   HPI  Matthew Madden is a 46 y.o. male with history of Crohn's disease on Humira every 2 weeks who is right-hand-dominant who presents to the emergency department with 2 separate complaints.    Patient states that he recently got a splinter in his right third finger.  He thinks that this was on Wednesday of last week but is not sure.  He states that the hand was not bothering him and was not swollen until today.  He states he was able to get the splinter out on his own using a needle at home.  He has no fever.  No drainage from this wound.   Patient also reports that he fell off of a ladder a week ago at home.  States he landed on his back.  Complaining of back pain but no numbness, tingling, weakness, bowel or bladder incontinence.  No head injury.  Not on blood thinners.   History provided by patient.    Past Medical History:  Diagnosis Date   Crohn's colitis (HCC)     History reviewed. No pertinent surgical history.  MEDICATIONS:  Prior to Admission medications   Not on File    Physical Exam   Triage Vital Signs: ED Triage Vitals  Enc Vitals Group     BP 06/16/22 2345 (!) 141/88     Pulse Rate 06/16/22 2345 73     Resp 06/16/22 2345 16     Temp 06/16/22 2345 98.9 F (37.2 C)     Temp Source 06/16/22 2345 Oral     SpO2 06/16/22 2345 99 %     Weight 06/16/22 2354 165 lb (74.8 kg)     Height 06/16/22 2354 5\' 10"  (1.778 m)     Head Circumference --      Peak Flow --      Pain Score 06/16/22 2353 8     Pain Loc --      Pain Edu? --      Excl. in GC? --     Most recent vital signs: Vitals:   06/17/22 0222 06/17/22 0515  BP: 136/86   Pulse: 77   Resp: 18   Temp:  98.1 F (36.7 C)  SpO2: 99%      CONSTITUTIONAL: Alert and oriented and responds appropriately to  questions. Well-appearing; well-nourished; GCS 15, afebrile, nontoxic in appearance HEAD: Normocephalic; atraumatic EYES: Conjunctivae clear, PERRL, EOMI ENT: normal nose; no rhinorrhea; moist mucous membranes; pharynx without lesions noted; no dental injury; no septal hematoma, no epistaxis; no facial deformity or bony tenderness NECK: Supple, no midline spinal tenderness, step-off or deformity; trachea midline CARD: RRR; S1 and S2 appreciated; no murmurs, no clicks, no rubs, no gallops RESP: Normal chest excursion without splinting or tachypnea; breath sounds clear and equal bilaterally; no wheezes, no rhonchi, no rales; no hypoxia or respiratory distress CHEST:  chest wall stable, no crepitus or ecchymosis or deformity, nontender to palpation; no flail chest ABD/GI: Normal bowel sounds; non-distended; soft, non-tender, no rebound, no guarding; no ecchymosis or other lesions noted PELVIS:  stable, nontender to palpation BACK:  The back appears normal; tender over the lower lumbar spine without step-off or deformity.  No soft tissue swelling, ecchymosis, rash or other lesions. EXT: Patient has soft tissue swelling, redness and warmth  noted to the right third digit that extends into the dorsal hand.  He has an area of fluctuance with what looks like a small healing wound to the palmar aspect of the right third digit without any drainage.  He has a 2+ right radial pulse and his compartments are soft.  Normal sensation distally and capillary refill is normal.  He has no tenderness over the flexor tendon of the distal right third finger and is able to flex and extend at the DIP without difficulty but some limitation at the PIP and MCP just due to swelling. SKIN: Normal color for age and race; warm NEURO: No facial asymmetry, normal speech, moving all extremities equally, normal sensation diffusely, no saddle anesthesia, normal gait      RIGHT hand   Patient gave verbal permission to utilize photo  for medical documentation only. The image was not stored on any personal device.  ED Results / Procedures / Treatments   LABS: (all labs ordered are listed, but only abnormal results are displayed) Labs Reviewed  COMPREHENSIVE METABOLIC PANEL - Abnormal; Notable for the following components:      Result Value   Potassium 3.2 (*)    Glucose, Bld 139 (*)    All other components within normal limits  CBC WITH DIFFERENTIAL/PLATELET - Abnormal; Notable for the following components:   WBC 10.9 (*)    RBC 3.96 (*)    Hemoglobin 12.6 (*)    HCT 38.4 (*)    All other components within normal limits  CULTURE, BLOOD (ROUTINE X 2)  CULTURE, BLOOD (ROUTINE X 2)  AEROBIC CULTURE W GRAM STAIN (SUPERFICIAL SPECIMEN)  LACTIC ACID, PLASMA  CBC WITH DIFFERENTIAL/PLATELET  CREATININE, SERUM  HIV ANTIBODY (ROUTINE TESTING W REFLEX)     EKG:    RADIOLOGY: My personal review and interpretation of imaging: X-ray to the right finger shows no bony abnormality or retained foreign body.  X-ray of the lumbar spine shows no fracture.  I have personally reviewed all radiology reports. DG Lumbar Spine Complete  Result Date: 06/17/2022 CLINICAL DATA:  Larey Seat 3 days ago, with low back pain since then, and pain in the right hand middle finger after having removed a wooden splinter from the digit yesterday. EXAM: LUMBAR SPINE - COMPLETE 4+ VIEW; RIGHT MIDDLE FINGER 2+V COMPARISON:  None Available. FINDINGS: Right middle finger series, three views: There is moderate swelling in the long finger particularly proximally. No soft tissue gas or radiopaque foreign body is seen. The underlying bones are unremarkable without evidence of fracture, dislocation, destructive lesion or degenerative changes. There is normal bone mineralization. Lumbar spine: There is no evidence of lumbar spine fracture. Alignment is normal. Intervertebral disc spaces are maintained above L5, with mild disc space loss at L5-S1. There is early facet  joint spurring from L3-4 down without appreciable foraminal stenosis. There is mild lumbar spondylosis. The SI joints are unremarkable, as visualized. There are intestinal surgical changes in the left lower abdomen as well as nearby clustered surgical clips. IMPRESSION: 1. Swelling in the middle finger without acute underlying bone abnormality, visible foreign body or soft tissue gas. 2. Mild degenerative changes of the lumbar spine without evidence of fractures. Electronically Signed   By: Almira Bar M.D.   On: 06/17/2022 00:57   DG Finger Middle Right  Result Date: 06/17/2022 CLINICAL DATA:  Larey Seat 3 days ago, with low back pain since then, and pain in the right hand middle finger after having removed a wooden splinter from the  digit yesterday. EXAM: LUMBAR SPINE - COMPLETE 4+ VIEW; RIGHT MIDDLE FINGER 2+V COMPARISON:  None Available. FINDINGS: Right middle finger series, three views: There is moderate swelling in the long finger particularly proximally. No soft tissue gas or radiopaque foreign body is seen. The underlying bones are unremarkable without evidence of fracture, dislocation, destructive lesion or degenerative changes. There is normal bone mineralization. Lumbar spine: There is no evidence of lumbar spine fracture. Alignment is normal. Intervertebral disc spaces are maintained above L5, with mild disc space loss at L5-S1. There is early facet joint spurring from L3-4 down without appreciable foraminal stenosis. There is mild lumbar spondylosis. The SI joints are unremarkable, as visualized. There are intestinal surgical changes in the left lower abdomen as well as nearby clustered surgical clips. IMPRESSION: 1. Swelling in the middle finger without acute underlying bone abnormality, visible foreign body or soft tissue gas. 2. Mild degenerative changes of the lumbar spine without evidence of fractures. Electronically Signed   By: Almira Bar M.D.   On: 06/17/2022 00:57      PROCEDURES:  Critical Care performed: No  INCISION AND DRAINAGE Performed by: Baxter Hire Jedrick Hutcherson Consent: Verbal consent obtained. Risks and benefits: risks, benefits and alternatives were discussed Type: abscess  Body area: Right third finger  Anesthesia: local infiltration  Incision was made with a scalpel.  2 small stab incisions were made on the palmar aspect of the proximal right third digit.  One was made on the radial aspect and one on the ulnar aspect.  There was a small amount of purulent drainage that came out from the radial incision but no purulent drainage from the lateral incision where he states most of his pain is present.  Local anesthetic: lidocaine 1% without epinephrine  Anesthetic total: 4 ml  Complexity: complex  Drainage: purulent  Drainage amount: Small  Packing material: None  Patient tolerance: Patient tolerated the procedure well with no immediate complications.     Procedures    IMPRESSION / MDM / ASSESSMENT AND PLAN / ED COURSE  I reviewed the triage vital signs and the nursing notes.  Patient here with right hand cellulitis with possible abscess.  Also reports fall off of a ladder.  Complaining of back pain.  No other injury.  The patient is on the cardiac monitor to evaluate for evidence of arrhythmia and/or significant heart rate changes.   DIFFERENTIAL DIAGNOSIS (includes but not limited to):   Hand abscess, cellulitis, clinically does not look like flexor tenosynovitis at this time.  No paronychia, felon, herpetic whitlow.  No systemic symptoms to suggest sepsis.  Patient also complaining of back pain after a fall a week ago.  Differential includes fracture, contusion.  Doubt cauda equina, epidural abscess or hematoma, discitis or osteomyelitis, transverse myelitis, spinal stenosis  Patient's presentation is most consistent with acute presentation with potential threat to life or bodily function.  PLAN: We will obtain CBC, BMP,  cultures, lactic.  Will perform incision and drainage of hand abscess and send wound culture.  Will provide with broad antibiotics and pain medication.  Will discuss with orthopedic surgery.  As for his back pain, will obtain x-rays of the lumbar spine.  No other sign of traumatic injury on exam.  Neurologically intact here.   MEDICATIONS GIVEN IN ED: Medications  vancomycin (VANCOREADY) IVPB 1750 mg/350 mL (has no administration in time range)  Tdap (BOOSTRIX) injection 0.5 mL (has no administration in time range)  0.9 %  sodium chloride infusion ( Intravenous New Bag/Given  06/17/22 0507)  enoxaparin (LOVENOX) injection 40 mg (has no administration in time range)  acetaminophen (TYLENOL) tablet 650 mg (has no administration in time range)    Or  acetaminophen (TYLENOL) suppository 650 mg (has no administration in time range)  ondansetron (ZOFRAN) tablet 4 mg (has no administration in time range)    Or  ondansetron (ZOFRAN) injection 4 mg (has no administration in time range)  cefTRIAXone (ROCEPHIN) 1 g in sodium chloride 0.9 % 100 mL IVPB (has no administration in time range)  HYDROcodone-acetaminophen (NORCO/VICODIN) 5-325 MG per tablet 1-2 tablet (has no administration in time range)  morphine (PF) 2 MG/ML injection 2 mg (has no administration in time range)  ketorolac (TORADOL) 30 MG/ML injection 30 mg (has no administration in time range)  oxyCODONE-acetaminophen (PERCOCET/ROXICET) 5-325 MG per tablet 1 tablet (1 tablet Oral Given 06/17/22 0223)  cefTRIAXone (ROCEPHIN) 2 g in sodium chloride 0.9 % 100 mL IVPB (0 g Intravenous Stopped 06/17/22 0524)  lidocaine (PF) (XYLOCAINE) 1 % injection 5 mL (5 mLs Intradermal Given 06/17/22 0505)  morphine (PF) 4 MG/ML injection 4 mg (4 mg Intravenous Given 06/17/22 0506)  ondansetron (ZOFRAN) injection 4 mg (4 mg Intravenous Given 06/17/22 0525)     ED COURSE: Patient's labs show leukocytosis of 10.9 without left shift.  Normal renal function,  electrolytes, LFTs.  Lactic is normal.  X-ray of the right hand reviewed and interpreted by myself and the radiologist and shows no retained foreign body, gas or bony abnormality.  I did perform incision and drainage at bedside and did get a small amount of purulent drainage from the stab incision that I may need on the palmar aspect on the radial side and sent a wound culture of this purulent drainage.  However he states most of his pain is more on the ulnar aspect of the finger so I made another stab incision in that area but did not get any purulent drainage from that side.  He will be seen by orthopedics in the afternoon.  Will discuss with medicine for admission.   X-ray of the lumbar spine was also reviewed and interpreted by myself radiology and shows mild degenerative changes without fracture or dislocation.  He is neurologically intact here.  I do not feel any further emergent imaging needed at this time but will likely need to follow-up with his PCP as an outpatient if he continues to have back pain.   CONSULTS:  4:30 AM  Discussed with Dr. Okey Dupre with orthopedics.   OUTSIDE RECORDS REVIEWED: Reviewed patient's last family medicine note for lower back pain on 04/25/2021.  Reviewed patient's last gastroenterology note with Mardee Postin on 03/26/2021       FINAL CLINICAL IMPRESSION(S) / ED DIAGNOSES   Final diagnoses:  Cellulitis of hand, right  Fall, initial encounter  Low back pain at multiple sites     Rx / DC Orders   ED Discharge Orders     None        Note:  This document was prepared using Dragon voice recognition software and may include unintentional dictation errors.   Leena Tiede, Layla Maw, DO 06/17/22 [phone]

## 2022-06-17 NOTE — Consult Note (Signed)
ORTHOPAEDIC CONSULTATION  REQUESTING PHYSICIAN: Andris Baumann, MD  Chief Complaint: Right finger infection  HPI: Matthew Madden is a 46 y.o. male who complains of right long finger pain, erythema, swelling. The pain is sharp in character. Denies any numbness, tingling or constitutional symptoms.  He had a splinter stuck in the palmar aspect of the long finger a few days prior. I&D was performed in the ER and the patient was admitted for IV antibiotics. Orthopedics was consulted for further management.  PMH notable for Crohn's disease on Humira.  Past Medical History:  Diagnosis Date   Crohn's colitis (HCC)    History reviewed. No pertinent surgical history. Social History   Socioeconomic History   Marital status: Single    Spouse name: Not on file   Number of children: Not on file   Years of education: Not on file   Highest education level: Not on file  Occupational History   Not on file  Tobacco Use   Smoking status: Not on file   Smokeless tobacco: Not on file  Substance and Sexual Activity   Alcohol use: Not on file   Drug use: Not on file   Sexual activity: Not on file  Other Topics Concern   Not on file  Social History Narrative   Not on file   Social Determinants of Health   Financial Resource Strain: Not on file  Food Insecurity: Not on file  Transportation Needs: Not on file  Physical Activity: Not on file  Stress: Not on file  Social Connections: Not on file   History reviewed. No pertinent family history. Not on File Prior to Admission medications   Not on File   DG Lumbar Spine Complete  Result Date: 06/17/2022 CLINICAL DATA:  Larey Seat 3 days ago, with low back pain since then, and pain in the right hand middle finger after having removed a wooden splinter from the digit yesterday. EXAM: LUMBAR SPINE - COMPLETE 4+ VIEW; RIGHT MIDDLE FINGER 2+V COMPARISON:  None Available. FINDINGS: Right middle finger series, three views: There is moderate swelling  in the long finger particularly proximally. No soft tissue gas or radiopaque foreign body is seen. The underlying bones are unremarkable without evidence of fracture, dislocation, destructive lesion or degenerative changes. There is normal bone mineralization. Lumbar spine: There is no evidence of lumbar spine fracture. Alignment is normal. Intervertebral disc spaces are maintained above L5, with mild disc space loss at L5-S1. There is early facet joint spurring from L3-4 down without appreciable foraminal stenosis. There is mild lumbar spondylosis. The SI joints are unremarkable, as visualized. There are intestinal surgical changes in the left lower abdomen as well as nearby clustered surgical clips. IMPRESSION: 1. Swelling in the middle finger without acute underlying bone abnormality, visible foreign body or soft tissue gas. 2. Mild degenerative changes of the lumbar spine without evidence of fractures. Electronically Signed   By: Almira Bar M.D.   On: 06/17/2022 00:57   DG Finger Middle Right  Result Date: 06/17/2022 CLINICAL DATA:  Larey Seat 3 days ago, with low back pain since then, and pain in the right hand middle finger after having removed a wooden splinter from the digit yesterday. EXAM: LUMBAR SPINE - COMPLETE 4+ VIEW; RIGHT MIDDLE FINGER 2+V COMPARISON:  None Available. FINDINGS: Right middle finger series, three views: There is moderate swelling in the long finger particularly proximally. No soft tissue gas or radiopaque foreign body is seen. The underlying bones are unremarkable without evidence of fracture, dislocation,  destructive lesion or degenerative changes. There is normal bone mineralization. Lumbar spine: There is no evidence of lumbar spine fracture. Alignment is normal. Intervertebral disc spaces are maintained above L5, with mild disc space loss at L5-S1. There is early facet joint spurring from L3-4 down without appreciable foraminal stenosis. There is mild lumbar spondylosis. The SI  joints are unremarkable, as visualized. There are intestinal surgical changes in the left lower abdomen as well as nearby clustered surgical clips. IMPRESSION: 1. Swelling in the middle finger without acute underlying bone abnormality, visible foreign body or soft tissue gas. 2. Mild degenerative changes of the lumbar spine without evidence of fractures. Electronically Signed   By: Almira Bar M.D.   On: 06/17/2022 00:57    Positive ROS: All other systems have been reviewed and were otherwise negative with the exception of those mentioned in the HPI and as above.  Physical Exam: General: Alert, no acute distress Cardiovascular: No pedal edema Respiratory: No cyanosis, no use of accessory musculature GI: No organomegaly, abdomen is soft and non-tender Skin: No lesions in the area of chief complaint Neurologic: Sensation intact distally Psychiatric: Patient is competent for consent with normal mood and affect Lymphatic: No axillary or cervical lymphadenopathy  MUSCULOSKELETAL:   See picture from H&P Right long finger with erythema, swelling over P1, no flexed posturing, no tenderness over the flexor tendon in the palm, no tenderness with PROM  Assessment: 46yo M admitted with right long finger infection s/p bedside I&D in the ER  Plan: - Continue IV antibiotics - Strict elevation of right hand, twice daily soapy soaks - Will continue to monitor for clinical improvement, consider MRI if symptoms are not improving over next 24-48 hours    Ross Marcus, MD    06/17/2022 2:21 PM

## 2022-06-17 NOTE — Assessment & Plan Note (Addendum)
Infected puncture wound right hand Possible flexor tenosynovitis Vancomycin and Rocephin --> po Clindamycin Pain control Ortho following: Consider MRI if not improving

## 2022-06-17 NOTE — Assessment & Plan Note (Signed)
On Humira weekly

## 2022-06-17 NOTE — Progress Notes (Signed)
Patient seen and examined at the bedside.  Interval events noted.  He complains of pain in the right hand.  Vital signs are stable.  Continue IV antibiotics and analgesics.  Follow-up with orthopedic surgeon for further recommendations.

## 2022-06-17 NOTE — Progress Notes (Signed)
Pharmacy Antibiotic Note  Matthew Madden is a 46 y.o. male admitted on 06/17/2022 with cellulitis.  Pharmacy has been consulted for Vancomycin dosing x 7 days.  Plan: Pt given Vancomycin 1750 mg once. Vancomycin 1250 mg IV Q 12 hrs. Goal AUC 400-550. Expected AUC: 512.2 SCr used: 0.95  Pharmacy will continue to follow and will adjust abx dosing whenever warranted.  Height: 5\' 10"  (177.8 cm) Weight: 74.8 kg (165 lb) IBW/kg (Calculated) : 73  Temp (24hrs), Avg:98.5 F (36.9 C), Min:98.1 F (36.7 C), Max:98.9 F (37.2 C)   Recent Labs  Lab 06/16/22 2357 06/17/22 0509  WBC  --  10.9*  CREATININE 0.95  --   LATICACIDVEN 1.9  --     Estimated Creatinine Clearance: 101.4 mL/min (by C-G formula based on SCr of 0.95 mg/dL).    Not on File  Antimicrobials this admission: 8/08 Ceftriaxone >>  8/08 Vancomycin >>   Microbiology results: 8/07 BCx: Pending  Thank you for allowing pharmacy to be a part of this patient's care.  10/07, PharmD, [facility] 06/17/2022 6:29 AM

## 2022-06-17 NOTE — Assessment & Plan Note (Signed)
Patient at high risk for infection

## 2022-06-17 NOTE — Progress Notes (Signed)
PHARMACY -  BRIEF ANTIBIOTIC NOTE   Pharmacy has received consult(s) for Vancomycin from an ED provider.  The patient's profile has been reviewed for ht/wt/allergies/indication/available labs.    One time order(s) placed for Vancomycin 1750 mg per pt wt of 74.8 kg.  Further antibiotics/pharmacy consults should be ordered by admitting physician if indicated.                       Thank you, Otelia Sergeant, PharmD, [facility] 06/17/2022 4:30 AM

## 2022-06-17 NOTE — H&P (Signed)
History and Physical    Patient: Matthew Madden DOB: [birth year] DOA: 06/17/2022 DOS: the patient was seen and examined on 06/17/2022 PCP: Patient, No Pcp Per  Patient coming from: Home  Chief Complaint:  Chief Complaint  Patient presents with   Hand Injury   Back Pain    HPI: Matthew Madden is a 46 y.o. male with medical history significant for Crohn's disease on Humira who presents to the ED with pain and swelling of his right hand that started a few days after getting a splinter stuck on the palmar side of his third finger.  He denies fever or chills. ED course and data review: Vitals within normal limits.  CBC pending.  Potassium 3.2 X-ray of the right hand shows the following: IMPRESSION: 1. Swelling in the middle finger without acute underlying bone abnormality, visible foreign body or soft tissue gas. 2. Mild degenerative changes of the lumbar spine without evidence of fractures.  The ED provider spoke with orthopedist Dr. Okey Dupre who agrees with admission and will see patient in the a.m.  Patient was started on vancomycin and Rocephin and hospitalist consulted for admission.     Past Medical History:  Diagnosis Date   Crohn's colitis (HCC)    History reviewed. No pertinent surgical history. Social History:  has no history on file for tobacco use, alcohol use, and drug use.  Not on File  History reviewed. No pertinent family history.  Prior to Admission medications   Not on File    Physical Exam: Vitals:   06/16/22 2345 06/16/22 2354 06/17/22 0222  BP: (!) 141/88  136/86  Pulse: 73  77  Resp: 16  18  Temp: 98.9 F (37.2 C)    TempSrc: Oral    SpO2: 99%  99%  Weight:  74.8 kg   Height:  5\' 10"  (1.778 m)    Physical Exam Vitals and nursing note reviewed.  Constitutional:      General: He is not in acute distress. HENT:     Head: Normocephalic and atraumatic.  Cardiovascular:     Rate and Rhythm: Normal rate and regular rhythm.      Heart sounds: Normal heart sounds.  Pulmonary:     Effort: Pulmonary effort is normal.     Breath sounds: Normal breath sounds.  Abdominal:     Palpations: Abdomen is soft.     Tenderness: There is no abdominal tenderness.  Neurological:     Mental Status: Mental status is at baseline.        Labs on Admission: I have personally reviewed following labs and imaging studies  CBC: No results for input(s): "WBC", "NEUTROABS", "HGB", "HCT", "MCV", "PLT" in the last 168 hours. Basic Metabolic Panel: Recent Labs  Lab 06/16/22 2357  NA 138  K 3.2*  CL 106  CO2 23  GLUCOSE 139*  BUN 16  CREATININE 0.95  CALCIUM 8.9   GFR: Estimated Creatinine Clearance: 101.4 mL/min (by C-G formula based on SCr of 0.95 mg/dL). Liver Function Tests: Recent Labs  Lab 06/16/22 2357  AST 22  ALT 12  ALKPHOS 63  BILITOT 0.5  PROT 7.2  ALBUMIN 4.1   No results for input(s): "LIPASE", "AMYLASE" in the last 168 hours. No results for input(s): "AMMONIA" in the last 168 hours. Coagulation Profile: No results for input(s): "INR", "PROTIME" in the last 168 hours. Cardiac Enzymes: No results for input(s): "CKTOTAL", "CKMB", "CKMBINDEX", "TROPONINI" in the last 168 hours. BNP (last 3 results) No results for  input(s): "PROBNP" in the last 8760 hours. HbA1C: No results for input(s): "HGBA1C" in the last 72 hours. CBG: No results for input(s): "GLUCAP" in the last 168 hours. Lipid Profile: No results for input(s): "CHOL", "HDL", "LDLCALC", "TRIG", "CHOLHDL", "LDLDIRECT" in the last 72 hours. Thyroid Function Tests: No results for input(s): "TSH", "T4TOTAL", "FREET4", "T3FREE", "THYROIDAB" in the last 72 hours. Anemia Panel: No results for input(s): "VITAMINB12", "FOLATE", "FERRITIN", "TIBC", "IRON", "RETICCTPCT" in the last 72 hours. Urine analysis:    Component Value Date/Time   COLORURINE Yellow 08/18/2012 1943   APPEARANCEUR Hazy 08/18/2012 1943   LABSPEC 1.024 08/18/2012 1943   PHURINE  6.0 08/18/2012 1943   GLUCOSEU Negative 08/18/2012 1943   HGBUR Negative 08/18/2012 1943   BILIRUBINUR Negative 08/18/2012 1943   KETONESUR Negative 08/18/2012 1943   PROTEINUR Negative 08/18/2012 1943   NITRITE Negative 08/18/2012 1943   LEUKOCYTESUR Negative 08/18/2012 1943    Radiological Exams on Admission: DG Lumbar Spine Complete  Result Date: 06/17/2022 CLINICAL DATA:  Larey Seat 3 days ago, with low back pain since then, and pain in the right hand middle finger after having removed a wooden splinter from the digit yesterday. EXAM: LUMBAR SPINE - COMPLETE 4+ VIEW; RIGHT MIDDLE FINGER 2+V COMPARISON:  None Available. FINDINGS: Right middle finger series, three views: There is moderate swelling in the long finger particularly proximally. No soft tissue gas or radiopaque foreign body is seen. The underlying bones are unremarkable without evidence of fracture, dislocation, destructive lesion or degenerative changes. There is normal bone mineralization. Lumbar spine: There is no evidence of lumbar spine fracture. Alignment is normal. Intervertebral disc spaces are maintained above L5, with mild disc space loss at L5-S1. There is early facet joint spurring from L3-4 down without appreciable foraminal stenosis. There is mild lumbar spondylosis. The SI joints are unremarkable, as visualized. There are intestinal surgical changes in the left lower abdomen as well as nearby clustered surgical clips. IMPRESSION: 1. Swelling in the middle finger without acute underlying bone abnormality, visible foreign body or soft tissue gas. 2. Mild degenerative changes of the lumbar spine without evidence of fractures. Electronically Signed   By: Almira Bar M.D.   On: 06/17/2022 00:57   DG Finger Middle Right  Result Date: 06/17/2022 CLINICAL DATA:  Larey Seat 3 days ago, with low back pain since then, and pain in the right hand middle finger after having removed a wooden splinter from the digit yesterday. EXAM: LUMBAR SPINE -  COMPLETE 4+ VIEW; RIGHT MIDDLE FINGER 2+V COMPARISON:  None Available. FINDINGS: Right middle finger series, three views: There is moderate swelling in the long finger particularly proximally. No soft tissue gas or radiopaque foreign body is seen. The underlying bones are unremarkable without evidence of fracture, dislocation, destructive lesion or degenerative changes. There is normal bone mineralization. Lumbar spine: There is no evidence of lumbar spine fracture. Alignment is normal. Intervertebral disc spaces are maintained above L5, with mild disc space loss at L5-S1. There is early facet joint spurring from L3-4 down without appreciable foraminal stenosis. There is mild lumbar spondylosis. The SI joints are unremarkable, as visualized. There are intestinal surgical changes in the left lower abdomen as well as nearby clustered surgical clips. IMPRESSION: 1. Swelling in the middle finger without acute underlying bone abnormality, visible foreign body or soft tissue gas. 2. Mild degenerative changes of the lumbar spine without evidence of fractures. Electronically Signed   By: Almira Bar M.D.   On: 06/17/2022 00:57     Data  Reviewed: Relevant notes from primary care and specialist visits, past discharge summaries as available in EHR, including Care Everywhere. Prior diagnostic testing as pertinent to current admission diagnoses Updated medications and problem lists for reconciliation ED course, including vitals, labs, imaging, treatment and response to treatment Triage notes, nursing and pharmacy notes and ED provider's notes Notable results as noted in HPI   Assessment and Plan: * Cellulitis of right hand Infected puncture wound right hand Possible flexor tenosynovitis Continue Vancomycin and Rocephin Pain control Ortho consulted  Immunosuppression due to drug therapy [facility]) Patient at high risk for infection  Crohn's disease (HCC) On Humira weekly        DVT prophylaxis:  Lovenox  Consults: Orthopedist, Dr. Okey Dupre  Advance Care Planning:   Code Status: Full Code   Family Communication: none  Disposition Plan: Back to previous home environment  Severity of Illness: The appropriate patient status for this patient is INPATIENT. Inpatient status is judged to be reasonable and necessary in order to provide the required intensity of service to ensure the patient's safety. The patient's presenting symptoms, physical exam findings, and initial radiographic and laboratory data in the context of their chronic comorbidities is felt to place them at high risk for further clinical deterioration. Furthermore, it is not anticipated that the patient will be medically stable for discharge from the hospital within 2 midnights of admission.   * I certify that at the point of admission it is my clinical judgment that the patient will require inpatient hospital care spanning beyond 2 midnights from the point of admission due to high intensity of service, high risk for further deterioration and high frequency of surveillance required.*  Author: Andris Baumann, MD 06/17/2022 5:14 AM  For on call review [email or web address].

## 2022-06-18 ENCOUNTER — Encounter: Payer: Self-pay | Admitting: Internal Medicine

## 2022-06-18 MED ORDER — MORPHINE SULFATE (PF) 2 MG/ML IV SOLN
2.0000 mg | Freq: Once | INTRAVENOUS | Status: AC
  Administered 2022-06-18: 2 mg via INTRAVENOUS

## 2022-06-18 NOTE — Progress Notes (Signed)
PROGRESS NOTE    Matthew Madden   TWS:[id number] DOB: 05-03-1976  DOA: 06/17/2022 Date of Service: 06/19/22 PCP: Patient, No Pcp Per     Brief Narrative / Hospital Course:  Matthew Madden is a 46 y.o. male with medical history significant for Crohn's disease on Humira who presents to the ED late 06/16/2022 with pain and swelling of his right hand that started a few days after getting a splinter stuck on the palmar side of his third finger.  He denies fever or chills. 08/08: In ED, incision and drainage performed, VSS, XR R hand (+) swelling third digit without acute underlying bony abnormality visible foreign body or soft tissue gas, mild degenerative changes.  ED spoke with Ortho, Dr. Okey Dupre, recommending admission and IV antibiotics.  Patient started on vancomycin, ceftriaxone.  Hospitalist admitted.  Per orthopedics: Continue IV abx, elevation right hand, consider MRI if symptoms not improving over next 24 to 48 hours. 08/09: Ortho saw early a.m., expressed purulent material from one of the poke hole incisions from the ER I&D, in mid-day performed I&D at bedside    Consultants:  Orthopedics  Procedures: Incision and drainage right third digit in emergency department 06/17/2022    Subjective: Patient reports finger is painful, no fever/chills, no chest pain/shortness of breath.     ASSESSMENT & PLAN:   Principal Problem:   Cellulitis of right hand Active Problems:   Crohn's disease (HCC)   Immunosuppression due to drug therapy (HCC)   Infected bite wound of hand, initial encounter   Cellulitis of right hand Infected puncture wound right hand Possible flexor tenosynovitis Continue Vancomycin and Rocephin Pain control Ortho following: Consider MRI if not improving  Crohn's disease (HCC) On Humira weekly  Immunosuppression due to drug therapy (HCC) Patient at high risk for infection    DVT prophylaxis: Lovenox Code Status: FULL Family Communication: None  at this time, patient declined call to family/support persons Disposition Plan / TOC needs: No TOC needs at this time Barriers to discharge / significant pending items: IV antibiotics, pending orthopedic recommendations and may need MRI/procedure if not improving             Objective: Vitals:   06/18/22 1559 06/18/22 1934 06/19/22 0321 06/19/22 0727  BP: 125/74 127/72 111/76 122/72  Pulse: 81 91 65 77  Resp: 18 16 17 19   Temp: 99 F (37.2 C) 98.2 F (36.8 C) 98.2 F (36.8 C) 98.3 F (36.8 C)  TempSrc:  Oral  Oral  SpO2: 98% 99% 96% 97%  Weight:      Height:        Intake/Output Summary (Last 24 hours) at 06/19/2022 0858 Last data filed at 06/18/2022 1859 Gross per 24 hour  Intake 480 ml  Output --  Net 480 ml   Filed Weights   06/16/22 2354  Weight: 74.8 kg    Examination:  Constitutional:  VS as above General Appearance: alert, well-developed, well-nourished, NAD Eyes: Normal lids and conjunctive, non-icteric sclerae Neck: No masses, trachea midline Respiratory: Normal respiratory effort No wheeze No rhonchi No rales Cardiovascular: S1/S2 normal No murmur No lower extremity edema Gastrointestinal: No tenderness No masses No hernia appreciated Musculoskeletal:  R 3rd digit bandage was not removed since orthopedics saw him this morning, right third digit is warm, intact active ROM, edema, mild erythema No clubbing/cyanosis of digits Symmetrical movement in all extremities Neurological: No cranial nerve deficit on limited exam Alert Psychiatric: Normal judgment/insight Normal mood and affect  Scheduled Medications:   enoxaparin (LOVENOX) injection  40 mg Subcutaneous Q24H    Continuous Infusions:  sodium chloride 125 mL/hr at 06/19/22 0241   cefTRIAXone (ROCEPHIN)  IV 1 g (06/19/22 3976)   vancomycin 1,250 mg (06/19/22 0649)    PRN Medications:  acetaminophen **OR** acetaminophen, HYDROcodone-acetaminophen, morphine  injection, ondansetron **OR** ondansetron (ZOFRAN) IV  Antimicrobials:  Anti-infectives (From admission, onward)    Start     Dose/Rate Route Frequency Ordered Stop   06/18/22 0600  cefTRIAXone (ROCEPHIN) 1 g in sodium chloride 0.9 % 100 mL IVPB        1 g 200 mL/hr over 30 Minutes Intravenous Every 24 hours 06/17/22 0502     06/17/22 1800  vancomycin (VANCOREADY) IVPB 1250 mg/250 mL        1,250 mg 166.7 mL/hr over 90 Minutes Intravenous Every 12 hours 06/17/22 0632 06/24/22 0559   06/17/22 0515  vancomycin (VANCOCIN) IVPB 1000 mg/200 mL premix  Status:  Discontinued        1,000 mg 200 mL/hr over 60 Minutes Intravenous  Once 06/17/22 0502 06/17/22 0524   06/17/22 0445  vancomycin (VANCOREADY) IVPB 1750 mg/350 mL        1,750 mg 175 mL/hr over 120 Minutes Intravenous  Once 06/17/22 0431 06/17/22 1037   06/17/22 0430  cefTRIAXone (ROCEPHIN) 2 g in sodium chloride 0.9 % 100 mL IVPB        2 g 200 mL/hr over 30 Minutes Intravenous  Once 06/17/22 0428 06/17/22 0524       Data Reviewed: I have personally reviewed following labs and imaging studies  CBC: Recent Labs  Lab 06/17/22 0509 06/19/22 0448  WBC 10.9* 8.2  NEUTROABS 7.2  --   HGB 12.6* 12.3*  HCT 38.4* 36.3*  MCV 97.0 95.3  PLT 248 246   Basic Metabolic Panel: Recent Labs  Lab 06/16/22 2357 06/17/22 1027  NA 138  --   K 3.2*  --   CL 106  --   CO2 23  --   GLUCOSE 139*  --   BUN 16  --   CREATININE 0.95 0.69  CALCIUM 8.9  --   MG 2.1  --    GFR: Estimated Creatinine Clearance: 120.4 mL/min (by C-G formula based on SCr of 0.69 mg/dL). Liver Function Tests: Recent Labs  Lab 06/16/22 2357  AST 22  ALT 12  ALKPHOS 63  BILITOT 0.5  PROT 7.2  ALBUMIN 4.1   No results for input(s): "LIPASE", "AMYLASE" in the last 168 hours. No results for input(s): "AMMONIA" in the last 168 hours. Coagulation Profile: No results for input(s): "INR", "PROTIME" in the last 168 hours. Cardiac Enzymes: No results for  input(s): "CKTOTAL", "CKMB", "CKMBINDEX", "TROPONINI" in the last 168 hours. BNP (last 3 results) No results for input(s): "PROBNP" in the last 8760 hours. HbA1C: No results for input(s): "HGBA1C" in the last 72 hours. CBG: No results for input(s): "GLUCAP" in the last 168 hours. Lipid Profile: No results for input(s): "CHOL", "HDL", "LDLCALC", "TRIG", "CHOLHDL", "LDLDIRECT" in the last 72 hours. Thyroid Function Tests: No results for input(s): "TSH", "T4TOTAL", "FREET4", "T3FREE", "THYROIDAB" in the last 72 hours. Anemia Panel: No results for input(s): "VITAMINB12", "FOLATE", "FERRITIN", "TIBC", "IRON", "RETICCTPCT" in the last 72 hours. Urine analysis:    Component Value Date/Time   COLORURINE Yellow 08/18/2012 1943   APPEARANCEUR Hazy 08/18/2012 1943   LABSPEC 1.024 08/18/2012 1943   PHURINE 6.0 08/18/2012 1943   GLUCOSEU Negative 08/18/2012 1943  HGBUR Negative 08/18/2012 1943   BILIRUBINUR Negative 08/18/2012 1943   KETONESUR Negative 08/18/2012 1943   PROTEINUR Negative 08/18/2012 1943   NITRITE Negative 08/18/2012 1943   LEUKOCYTESUR Negative 08/18/2012 1943   Sepsis Labs: @LABRCNTIP (procalcitonin:4,lacticidven:4)  Recent Results (from the past 240 hour(s))  Blood culture (routine x 2)     Status: None (Preliminary result)   Collection Time: 06/16/22 11:57 PM   Specimen: BLOOD  Result Value Ref Range Status   Specimen Description BLOOD RIGHT ASSIST CONTROL  Final   Special Requests   Final    BOTTLES DRAWN AEROBIC AND ANAEROBIC Blood Culture adequate volume   Culture   Final    NO GROWTH 2 DAYS Performed at [facility], [address] [place], [place]    Report Status PENDING  Incomplete  Blood culture (routine x 2)     Status: None (Preliminary result)   Collection Time: 06/16/22 11:57 PM   Specimen: BLOOD  Result Value Ref Range Status   Specimen Description BLOOD LEFT ASSIST CONTROL  Final   Special Requests   Final    BOTTLES DRAWN  AEROBIC AND ANAEROBIC Blood Culture results may not be optimal due to an excessive volume of blood received in culture bottles   Culture   Final    NO GROWTH 2 DAYS Performed at [facility], [address], [place], [place]    Report Status PENDING  Incomplete  Aerobic Culture w Gram Stain (superficial specimen)     Status: None (Preliminary result)   Collection Time: 06/17/22  5:09 AM   Specimen: Abscess  Result Value Ref Range Status   Specimen Description   Final    ABSCESS Performed at [facility], [address] [place], [place]    Special Requests   Final    NONE Performed at [facility], [address] [place], [place]    Gram Stain   Final    FEW WBC PRESENT,BOTH PMN AND MONONUCLEAR RARE GRAM NEGATIVE COCCI IN PAIRS    Culture   Final    MODERATE STAPHYLOCOCCUS AUREUS SUSCEPTIBILITIES TO FOLLOW Performed at [facility], [address] [place], [place]    Report Status PENDING  Incomplete         Radiology Studies: DG Lumbar Spine Complete  Result Date: 06/17/2022 CLINICAL DATA:  08/17/2022 3 days ago, with low back pain since then, and pain in the right hand middle finger after having removed a wooden splinter from the digit yesterday. EXAM: LUMBAR SPINE - COMPLETE 4+ VIEW; RIGHT MIDDLE FINGER 2+V COMPARISON:  None Available. FINDINGS: Right middle finger series, three views: There is moderate swelling in the long finger particularly proximally. No soft tissue gas or radiopaque foreign body is seen. The underlying bones are unremarkable without evidence of fracture, dislocation, destructive lesion or degenerative changes. There is normal bone mineralization. Lumbar spine: There is no evidence of lumbar spine fracture. Alignment is normal. Intervertebral disc spaces are maintained above L5, with mild disc space loss at L5-S1. There is early facet joint spurring from L3-4 down without appreciable  foraminal stenosis. There is mild lumbar spondylosis. The SI joints are unremarkable, as visualized. There are intestinal surgical changes in the left lower abdomen as well as nearby clustered surgical clips. IMPRESSION: 1. Swelling in the middle finger without acute underlying bone abnormality, visible foreign body or soft tissue gas. 2. Mild degenerative changes of the lumbar spine without evidence of  fractures. Electronically Signed   By: Almira Bar M.D.   On: 06/17/2022 00:57   DG Finger Middle Right  Result Date: 06/17/2022 CLINICAL DATA:  Larey Seat 3 days ago, with low back pain since then, and pain in the right hand middle finger after having removed a wooden splinter from the digit yesterday. EXAM: LUMBAR SPINE - COMPLETE 4+ VIEW; RIGHT MIDDLE FINGER 2+V COMPARISON:  None Available. FINDINGS: Right middle finger series, three views: There is moderate swelling in the long finger particularly proximally. No soft tissue gas or radiopaque foreign body is seen. The underlying bones are unremarkable without evidence of fracture, dislocation, destructive lesion or degenerative changes. There is normal bone mineralization. Lumbar spine: There is no evidence of lumbar spine fracture. Alignment is normal. Intervertebral disc spaces are maintained above L5, with mild disc space loss at L5-S1. There is early facet joint spurring from L3-4 down without appreciable foraminal stenosis. There is mild lumbar spondylosis. The SI joints are unremarkable, as visualized. There are intestinal surgical changes in the left lower abdomen as well as nearby clustered surgical clips. IMPRESSION: 1. Swelling in the middle finger without acute underlying bone abnormality, visible foreign body or soft tissue gas. 2. Mild degenerative changes of the lumbar spine without evidence of fractures. Electronically Signed   By: Almira Bar M.D.   On: 06/17/2022 00:57            LOS: 2 days      Sunnie Nielsen, DO [facility] 06/19/2022, 8:58 AM   Staff may message me via secure chat in Epic  but this may not receive immediate response,  please page for urgent matters!  If 7PM-7AM, please contact night-coverage [email or web address]  Dictation software was used to generate the above note. Typos may occur and escape review, as with typed/written notes. Please contact Dr Lyn Hollingshead directly for clarity if needed.

## 2022-06-18 NOTE — Progress Notes (Signed)
Pharmacy Antibiotic Note  Matthew Madden is a 46 y.o. male admitted on 06/17/2022 with cellulitis.  PMH significant for Crohn's disease on Humira. Patient presented to the ED with pain and swelling of his right hand that started a few days after getting a splinter stuck on the palmar side of his third finger. On presentation WBC 10.9, vitals WNL. He denied fever or chills. Pharmacy has been consulted for Vancomycin dosing x 7 days.  Plan: Continue Vancomycin 1250 mg IV Q12H Goal AUC 400-550. Expected AUC: 444.9 Expected Css min: 10.9 SCr used: 0.80 (actual 0.69)  Pharmacy will continue to follow and will adjust abx dosing whenever warranted.  Height: 5\' 10"  (177.8 cm) Weight: 74.8 kg (165 lb) IBW/kg (Calculated) : 73  Temp (24hrs), Avg:98 F (36.7 C), Min:97.8 F (36.6 C), Max:98.1 F (36.7 C)   Recent Labs  Lab 06/16/22 2357 06/17/22 0509 06/17/22 1027  WBC  --  10.9*  --   CREATININE 0.95  --  0.69  LATICACIDVEN 1.9  --   --      Estimated Creatinine Clearance: 120.4 mL/min (by C-G formula based on SCr of 0.69 mg/dL).    Not on File  Antimicrobials this admission: 8/8 Ceftriaxone >>  8/8 Vancomycin >>   Microbiology results: 8/7 BCx: NG24H x2 8/8 Wound cx: rare GNR, moderate Staph aureus (susceptibilities pending)  Thank you for allowing pharmacy to be a part of this patient's care.  10/7, PharmD PGY1 Pharmacy Resident 06/18/2022 8:51 AM

## 2022-06-18 NOTE — Hospital Course (Addendum)
Matthew Madden is a 46 y.o. male with medical history significant for Crohn's disease on Humira who presents to the ED late 06/16/2022 with pain and swelling of his right hand that started a few days after getting a splinter stuck on the palmar side of his third finger.  He denies fever or chills. 08/08: In ED, incision and drainage performed, VSS, XR R hand (+) swelling third digit without acute underlying bony abnormality visible foreign body or soft tissue gas, mild degenerative changes.  ED spoke with Ortho, Dr. Okey Dupre, recommending admission and IV antibiotics.  Patient started on vancomycin, ceftriaxone.  Hospitalist admitted.  Per orthopedics: Continue IV abx, elevation right hand, consider MRI if symptoms not improving over next 24 to 48 hours. 08/09: Ortho saw early a.m., expressed purulent material from one of the poke hole incisions from the ER I&D, in mid-day performed I&D at bedside  08/10: Ortho saw early a.m., erythema and fluctuance noted to be improving w/ incisions continuing to drain, recommend continued soapy soaks 2-3 times per day, and continued elevation. BCx NGx2d. Wound Cx growing staph aureus, susceptibilities have resulted, will start on p.o. abx and if continuing to improve into tomorrow should be okay for discharge, if worse will obtain MRI 08/11: swelling down, pain improved, no fever/chills, tolerating po abx and agreeable for discharge.

## 2022-06-18 NOTE — Progress Notes (Signed)
Subjective:  Patient re is ports pain as somewhat improved.    Objective:   VITALS:   Vitals:   06/17/22 1616 06/17/22 2054 06/18/22 0453 06/18/22 0744  BP: 120/78 109/86 116/74 117/83  Pulse: 70 85 86 65  Resp: 18 20 20 18   Temp: 98 F (36.7 C) 98.1 F (36.7 C) 98 F (36.7 C) 97.8 F (36.6 C)  TempSrc:  Oral Oral   SpO2: 98% 99% 99% 100%  Weight:      Height:        PHYSICAL EXAM:  Right hand: Erythema and swelling along the palmar aspect of the long finger P1, no tenderness about P2 or P3 or along the flexor tendon sheath in the palm, no flexed posturing able to flex PIP and DIP  I was able to express purulence from one of the poke hole incisions from I&D  LABS  Results for orders placed or performed during the hospital encounter of 06/17/22 (from the past 24 hour(s))  Creatinine, serum     Status: None   Collection Time: 06/17/22 10:27 AM  Result Value Ref Range   Creatinine, Ser 0.69 0.61 - 1.24 mg/dL   GFR, Estimated 08/17/22 >84 mL/min  HIV Antibody (routine testing w rflx)     Status: None   Collection Time: 06/17/22 10:27 AM  Result Value Ref Range   HIV Screen 4th Generation wRfx Non Reactive Non Reactive    DG Lumbar Spine Complete  Result Date: 06/17/2022 CLINICAL DATA:  08/17/2022 3 days ago, with low back pain since then, and pain in the right hand middle finger after having removed a wooden splinter from the digit yesterday. EXAM: LUMBAR SPINE - COMPLETE 4+ VIEW; RIGHT MIDDLE FINGER 2+V COMPARISON:  None Available. FINDINGS: Right middle finger series, three views: There is moderate swelling in the long finger particularly proximally. No soft tissue gas or radiopaque foreign body is seen. The underlying bones are unremarkable without evidence of fracture, dislocation, destructive lesion or degenerative changes. There is normal bone mineralization. Lumbar spine: There is no evidence of lumbar spine fracture. Alignment is normal. Intervertebral disc spaces are  maintained above L5, with mild disc space loss at L5-S1. There is early facet joint spurring from L3-4 down without appreciable foraminal stenosis. There is mild lumbar spondylosis. The SI joints are unremarkable, as visualized. There are intestinal surgical changes in the left lower abdomen as well as nearby clustered surgical clips. IMPRESSION: 1. Swelling in the middle finger without acute underlying bone abnormality, visible foreign body or soft tissue gas. 2. Mild degenerative changes of the lumbar spine without evidence of fractures. Electronically Signed   By: Larey Seat M.D.   On: 06/17/2022 00:57   DG Finger Middle Right  Result Date: 06/17/2022 CLINICAL DATA:  08/17/2022 3 days ago, with low back pain since then, and pain in the right hand middle finger after having removed a wooden splinter from the digit yesterday. EXAM: LUMBAR SPINE - COMPLETE 4+ VIEW; RIGHT MIDDLE FINGER 2+V COMPARISON:  None Available. FINDINGS: Right middle finger series, three views: There is moderate swelling in the long finger particularly proximally. No soft tissue gas or radiopaque foreign body is seen. The underlying bones are unremarkable without evidence of fracture, dislocation, destructive lesion or degenerative changes. There is normal bone mineralization. Lumbar spine: There is no evidence of lumbar spine fracture. Alignment is normal. Intervertebral disc spaces are maintained above L5, with mild disc space loss at L5-S1. There is early facet joint spurring from L3-4  down without appreciable foraminal stenosis. There is mild lumbar spondylosis. The SI joints are unremarkable, as visualized. There are intestinal surgical changes in the left lower abdomen as well as nearby clustered surgical clips. IMPRESSION: 1. Swelling in the middle finger without acute underlying bone abnormality, visible foreign body or soft tissue gas. 2. Mild degenerative changes of the lumbar spine without evidence of fractures. Electronically  Signed   By: Almira Bar M.D.   On: 06/17/2022 00:57    Assessment/Plan:    46 year old male admitted with right long finger infection, status post I&D in the ER  -I was able to express purulence from one of the poke hole incisions from the ER I&D.  Recommend soapy soaks 20 to 30 minutes 3 times per day and allowing this to continue to drain and decompress -Continue antibiotics -Will continue to monitor for clinical improvement   Ross Marcus , MD 06/18/2022, 8:03 AM

## 2022-06-19 ENCOUNTER — Encounter: Payer: Self-pay | Admitting: Internal Medicine

## 2022-06-19 LAB — AEROBIC CULTURE W GRAM STAIN (SUPERFICIAL SPECIMEN)

## 2022-06-19 LAB — CBC
HCT: 36.3 % — ABNORMAL LOW (ref 39.0–52.0)
Hemoglobin: 12.3 g/dL — ABNORMAL LOW (ref 13.0–17.0)
MCH: 32.3 pg (ref 26.0–34.0)
MCHC: 33.9 g/dL (ref 30.0–36.0)
MCV: 95.3 fL (ref 80.0–100.0)
Platelets: 246 10*3/uL (ref 150–400)
RBC: 3.81 MIL/uL — ABNORMAL LOW (ref 4.22–5.81)
RDW: 12.4 % (ref 11.5–15.5)
WBC: 8.2 10*3/uL (ref 4.0–10.5)
nRBC: 0 % (ref 0.0–0.2)

## 2022-06-19 MED ORDER — CLINDAMYCIN HCL 150 MG PO CAPS
300.0000 mg | ORAL_CAPSULE | Freq: Three times a day (TID) | ORAL | Status: DC
  Administered 2022-06-19 – 2022-06-20 (×3): 300 mg via ORAL
  Filled 2022-06-19 (×4): qty 2

## 2022-06-19 NOTE — Progress Notes (Signed)
Mobility Specialist - Progress Note    06/19/22 1430  Mobility  Activity Ambulated independently in hallway  Level of Assistance Independent  Assistive Device None  Distance Ambulated (ft) 640 ft  Activity Response Tolerated well  $Mobility charge 1 Mobility    Pt ambulates 4 laps around NS indep.   Terrilyn Saver  Mobility Specialist  06/19/22 2:32 PM

## 2022-06-19 NOTE — Progress Notes (Signed)
Pharmacy Antibiotic Note  Matthew Madden is a 46 y.o. male admitted on 06/17/2022 with cellulitis.  PMH significant for Crohn's disease on Humira. Patient presented to the ED with pain and swelling of his right hand that started a few days after getting a splinter stuck on the palmar side of his third finger. On presentation WBC 10.9, vitals WNL. He denied fever or chills. Pharmacy has been consulted for Vancomycin dosing x 7 days.  Plan: Day 3 of antibiotics. Continue Vancomycin 1250 mg IV Q12H. Plan to order levels after susceptibilities come back and if vanc needs to be continued. Goal AUC 400-550. Expected AUC: 444.9 Expected Css min: 10.9 SCr used: 0.80 (actual 0.69)  Pharmacy will continue to follow and will adjust abx dosing whenever warranted.  Height: 5\' 10"  (177.8 cm) Weight: 74.8 kg (165 lb) IBW/kg (Calculated) : 73  Temp (24hrs), Avg:98.4 F (36.9 C), Min:98.2 F (36.8 C), Max:99 F (37.2 C)   Recent Labs  Lab 06/16/22 2357 06/17/22 0509 06/17/22 1027 06/19/22 0448  WBC  --  10.9*  --  8.2  CREATININE 0.95  --  0.69  --   LATICACIDVEN 1.9  --   --   --      Estimated Creatinine Clearance: 120.4 mL/min (by C-G formula based on SCr of 0.69 mg/dL).    Not on File  Antimicrobials this admission: 8/8 Ceftriaxone >>  8/8 Vancomycin >>   Microbiology results: 8/7 BCx: NG48H x2 8/8 Wound cx: rare GNR, moderate Staph aureus (susceptibilities pending)  Thank you for allowing pharmacy to be a part of this patient's care.  10/8, PharmD PGY1 Pharmacy Resident 06/19/2022 8:50 AM

## 2022-06-19 NOTE — Plan of Care (Signed)
  Problem: Clinical Measurements: Goal: Ability to avoid or minimize complications of infection will improve Outcome: Progressing   Problem: Skin Integrity: Goal: Skin integrity will improve Outcome: Progressing   Problem: Education: Goal: Knowledge of General Education information will improve Description: Including pain rating scale, medication(s)/side effects and non-pharmacologic comfort measures Outcome: Progressing   Problem: Health Behavior/Discharge Planning: Goal: Ability to manage health-related needs will improve Outcome: Progressing   Problem: Clinical Measurements: Goal: Ability to maintain clinical measurements within normal limits will improve Outcome: Progressing Goal: Will remain free from infection Outcome: Progressing Goal: Diagnostic test results will improve Outcome: Progressing Goal: Respiratory complications will improve Outcome: Progressing Goal: Cardiovascular complication will be avoided Outcome: Progressing   

## 2022-06-19 NOTE — Progress Notes (Signed)
  Subjective:  Patient reports that pain and discomfort are somewhat improved after bedside I&D yesterday.  Objective:   VITALS:   Vitals:   06/18/22 1559 06/18/22 1934 06/19/22 0321 06/19/22 0727  BP: 125/74 127/72 111/76 122/72  Pulse: 81 91 65 77  Resp: 18 16 17 19   Temp: 99 F (37.2 C) 98.2 F (36.8 C) 98.2 F (36.8 C) 98.3 F (36.8 C)  TempSrc:  Oral  Oral  SpO2: 98% 99% 96% 97%  Weight:      Height:        PHYSICAL EXAM:  Right hand/finger: Erythema and fluctuance along the palmar aspect of P1 is slightly improved.  2 incisions along the radial and ulnar aspect of the palmar side of the long finger with small amount of drainage, no tenderness about P2 or P3 or along the flexor tendon sheath in the palm, no flexed posturing able to flex PIP and DIP   LABS  Results for orders placed or performed during the hospital encounter of 06/17/22 (from the past 24 hour(s))  CBC     Status: Abnormal   Collection Time: 06/19/22  4:48 AM  Result Value Ref Range   WBC 8.2 4.0 - 10.5 K/uL   RBC 3.81 (L) 4.22 - 5.81 MIL/uL   Hemoglobin 12.3 (L) 13.0 - 17.0 g/dL   HCT 08/19/22 (L) 57.8 - 46.9 %   MCV 95.3 80.0 - 100.0 fL   MCH 32.3 26.0 - 34.0 pg   MCHC 33.9 30.0 - 36.0 g/dL   RDW 62.9 52.8 - 41.3 %   Platelets 246 150 - 400 K/uL   nRBC 0.0 0.0 - 0.2 %    No results found.  Assessment/Plan:    46 year old male admitted with right long finger infection, status post I&D in the ER 8/8, bedside I&D 8/9  -Patient's erythema and fluctuance are improving.  The incisions are continuing to drain.  Recommend continued soapy soaks 2-3 times per day, and continued elevation. -Will discuss potential transition to oral antibiotics and timing for discharge home with the hospitalist team     10/9 , MD 06/19/2022, 8:04 AM

## 2022-06-19 NOTE — Plan of Care (Signed)
  Problem: Clinical Measurements: Goal: Ability to avoid or minimize complications of infection will improve 06/19/2022 0950 by Ellis Parents, RN Outcome: Progressing 06/19/2022 0949 by Ellis Parents, RN Outcome: Progressing   Problem: Skin Integrity: Goal: Skin integrity will improve 06/19/2022 0950 by Ellis Parents, RN Outcome: Progressing 06/19/2022 0949 by Ellis Parents, RN Outcome: Progressing   Problem: Education: Goal: Knowledge of General Education information will improve Description: Including pain rating scale, medication(s)/side effects and non-pharmacologic comfort measures 06/19/2022 0950 by Ellis Parents, RN Outcome: Progressing 06/19/2022 0949 by Ellis Parents, RN Outcome: Progressing   Problem: Health Behavior/Discharge Planning: Goal: Ability to manage health-related needs will improve 06/19/2022 0950 by Ellis Parents, RN Outcome: Progressing 06/19/2022 0949 by Ellis Parents, RN Outcome: Progressing   Problem: Clinical Measurements: Goal: Ability to maintain clinical measurements within normal limits will improve 06/19/2022 0950 by Ellis Parents, RN Outcome: Progressing 06/19/2022 0949 by Ellis Parents, RN Outcome: Progressing   Problem: Clinical Measurements: Goal: Ability to maintain clinical measurements within normal limits will improve 06/19/2022 0950 by Ellis Parents, RN Outcome: Progressing 06/19/2022 0949 by Ellis Parents, RN Outcome: Progressing Goal: Will remain free from infection 06/19/2022 0950 by Ellis Parents, RN Outcome: Progressing 06/19/2022 0949 by Ellis Parents, RN Outcome: Progressing Goal: Diagnostic test results will improve 06/19/2022 0950 by Ellis Parents, RN Outcome: Progressing 06/19/2022 0949 by Ellis Parents, RN Outcome: Progressing Goal: Respiratory complications will improve 06/19/2022 0950 by Ellis Parents, RN Outcome: Progressing 06/19/2022 0949  by Ellis Parents, RN Outcome: Progressing Goal: Cardiovascular complication will be avoided 06/19/2022 0950 by Ellis Parents, RN Outcome: Progressing 06/19/2022 0949 by Ellis Parents, RN Outcome: Progressing

## 2022-06-19 NOTE — Progress Notes (Signed)
PROGRESS NOTE    IZAIHA DUPUIS   [id number] DOB: [birth year]  DOA: 06/17/2022 Date of Service: 06/19/22 PCP: Patient, No Pcp Per     Brief Narrative / Hospital Course:  KRISZTIAN SIMONETTI is a 46 y.o. male with medical history significant for Crohn's disease on Humira who presents to the ED late 06/16/2022 with pain and swelling of his right hand that started a few days after getting a splinter stuck on the palmar side of his third finger.  He denies fever or chills. 08/08: In ED, incision and drainage performed, VSS, XR R hand (+) swelling third digit without acute underlying bony abnormality visible foreign body or soft tissue gas, mild degenerative changes.  ED spoke with Ortho, Dr. Sharlet Salina, recommending admission and IV antibiotics.  Patient started on vancomycin, ceftriaxone.  Hospitalist admitted.  Per orthopedics: Continue IV abx, elevation right hand, consider MRI if symptoms not improving over next 24 to 48 hours. 08/09: Ortho saw early a.m., expressed purulent material from one of the poke hole incisions from the ER I&D, in mid-day performed I&D at bedside  08/10: Ortho saw early a.m., erythema and fluctuance noted to be improving w/ incisions continuing to drain, recommend continued soapy soaks 2-3 times per day, and continued elevation. BCx NGx2d. Wound Cx growing staph aureus, susceptibilities have resulted, will start on p.o. abx and if continuing to improve into tomorrow should be okay for discharge, if worse will obtain MRI   Consultants:  Orthopedics  Procedures: Incision and drainage right third digit in emergency department 06/17/2022    Subjective: Patient reports finger is painful, no fever/chills, no chest pain/shortness of breath.     ASSESSMENT & PLAN:   Principal Problem:   Cellulitis of right hand Active Problems:   Crohn's disease ([place])   Immunosuppression due to drug therapy ([place])   Infected bite wound of hand, initial encounter   Cellulitis  of right hand Infected puncture wound right hand Possible flexor tenosynovitis Vancomycin and Rocephin --> po Clindamycin Pain control Ortho following: Consider MRI if not improving  Crohn's disease ([place]) On Humira weekly  Immunosuppression due to drug therapy ([place]) Patient at high risk for infection    DVT prophylaxis: Lovenox Code Status: FULL Family Communication: None at this time, patient declined call to family/support persons Disposition Plan / TOC needs: No TOC needs at this time Barriers to discharge / significant pending items: IV antibiotics --> po and if continuing to do well by tomorrow should be okay for discharge, if worsening/no improvement will need MRI             Objective: Vitals:   06/18/22 1559 06/18/22 1934 06/19/22 0321 06/19/22 0727  BP: 125/74 127/72 111/76 122/72  Pulse: 81 91 65 77  Resp: 18 16 17 19   Temp: 99 F (37.2 C) 98.2 F (36.8 C) 98.2 F (36.8 C) 98.3 F (36.8 C)  TempSrc:  Oral  Oral  SpO2: 98% 99% 96% 97%  Weight:      Height:        Intake/Output Summary (Last 24 hours) at 06/19/2022 1426 Last data filed at 06/18/2022 1859 Gross per 24 hour  Intake 240 ml  Output --  Net 240 ml   Filed Weights   06/16/22 2354  Weight: 74.8 kg    Examination:  Constitutional:  VS as above General Appearance: alert, well-developed, well-nourished, NAD Eyes: Normal lids and conjunctive, non-icteric sclerae Neck: No masses, trachea midline Respiratory: Normal respiratory effort CTA Cardiovascular: S1/S2 normal Musculoskeletal:  R 3rd digit bandage was not removed since orthopedics saw him this morning, right third digit is warm, intact active ROM, edema, mild erythema No clubbing/cyanosis of digits Symmetrical movement in all extremities Neurological: No cranial nerve deficit on limited exam Alert Psychiatric: Normal judgment/insight Normal mood and affect       Scheduled Medications:   clindamycin  300 mg Oral  Q8H   enoxaparin (LOVENOX) injection  40 mg Subcutaneous Q24H    Continuous Infusions:  sodium chloride 125 mL/hr at 06/19/22 0241    PRN Medications:  acetaminophen **OR** acetaminophen, HYDROcodone-acetaminophen, morphine injection, ondansetron **OR** ondansetron (ZOFRAN) IV  Antimicrobials:  Anti-infectives (From admission, onward)    Start     Dose/Rate Route Frequency Ordered Stop   06/19/22 1515  clindamycin (CLEOCIN) capsule 300 mg        300 mg Oral Every 8 hours 06/19/22 1426     06/18/22 0600  cefTRIAXone (ROCEPHIN) 1 g in sodium chloride 0.9 % 100 mL IVPB  Status:  Discontinued        1 g 200 mL/hr over 30 Minutes Intravenous Every 24 hours 06/17/22 0502 06/19/22 1426   06/17/22 1800  vancomycin (VANCOREADY) IVPB 1250 mg/250 mL  Status:  Discontinued        1,250 mg 166.7 mL/hr over 90 Minutes Intravenous Every 12 hours 06/17/22 0632 06/19/22 1426   06/17/22 0515  vancomycin (VANCOCIN) IVPB 1000 mg/200 mL premix  Status:  Discontinued        1,000 mg 200 mL/hr over 60 Minutes Intravenous  Once 06/17/22 0502 06/17/22 0524   06/17/22 0445  vancomycin (VANCOREADY) IVPB 1750 mg/350 mL        1,750 mg 175 mL/hr over 120 Minutes Intravenous  Once 06/17/22 0431 06/17/22 1037   06/17/22 0430  cefTRIAXone (ROCEPHIN) 2 g in sodium chloride 0.9 % 100 mL IVPB        2 g 200 mL/hr over 30 Minutes Intravenous  Once 06/17/22 0428 06/17/22 0524       Data Reviewed: I have personally reviewed following labs and imaging studies  CBC: Recent Labs  Lab 06/17/22 0509 06/19/22 0448  WBC 10.9* 8.2  NEUTROABS 7.2  --   HGB 12.6* 12.3*  HCT 38.4* 36.3*  MCV 97.0 95.3  PLT 248 246   Basic Metabolic Panel: Recent Labs  Lab 06/16/22 2357 06/17/22 1027  NA 138  --   K 3.2*  --   CL 106  --   CO2 23  --   GLUCOSE 139*  --   BUN 16  --   CREATININE 0.95 0.69  CALCIUM 8.9  --   MG 2.1  --    GFR: Estimated Creatinine Clearance: 120.4 mL/min (by C-G formula based on SCr of  0.69 mg/dL). Liver Function Tests: Recent Labs  Lab 06/16/22 2357  AST 22  ALT 12  ALKPHOS 63  BILITOT 0.5  PROT 7.2  ALBUMIN 4.1   No results for input(s): "LIPASE", "AMYLASE" in the last 168 hours. No results for input(s): "AMMONIA" in the last 168 hours. Coagulation Profile: No results for input(s): "INR", "PROTIME" in the last 168 hours. Cardiac Enzymes: No results for input(s): "CKTOTAL", "CKMB", "CKMBINDEX", "TROPONINI" in the last 168 hours. BNP (last 3 results) No results for input(s): "PROBNP" in the last 8760 hours. HbA1C: No results for input(s): "HGBA1C" in the last 72 hours. CBG: No results for input(s): "GLUCAP" in the last 168 hours. Lipid Profile: No results for input(s): "CHOL", "HDL", "LDLCALC", "TRIG", "  CHOLHDL", "LDLDIRECT" in the last 72 hours. Thyroid Function Tests: No results for input(s): "TSH", "T4TOTAL", "FREET4", "T3FREE", "THYROIDAB" in the last 72 hours. Anemia Panel: No results for input(s): "VITAMINB12", "FOLATE", "FERRITIN", "TIBC", "IRON", "RETICCTPCT" in the last 72 hours. Urine analysis:    Component Value Date/Time   COLORURINE Yellow 08/18/2012 1943   APPEARANCEUR Hazy 08/18/2012 1943   LABSPEC 1.024 08/18/2012 1943   PHURINE 6.0 08/18/2012 1943   GLUCOSEU Negative 08/18/2012 1943   HGBUR Negative 08/18/2012 [place] Negative 08/18/2012 1943   KETONESUR Negative 08/18/2012 1943   PROTEINUR Negative 08/18/2012 1943   NITRITE Negative 08/18/2012 1943   LEUKOCYTESUR Negative 08/18/2012 1943   Sepsis Labs: @LABRCNTIP (procalcitonin:4,lacticidven:4)  Recent Results (from the past 240 hour(s))  Blood culture (routine x 2)     Status: None (Preliminary result)   Collection Time: 06/16/22 11:57 PM   Specimen: BLOOD  Result Value Ref Range Status   Specimen Description BLOOD RIGHT ASSIST CONTROL  Final   Special Requests   Final    BOTTLES DRAWN AEROBIC AND ANAEROBIC Blood Culture adequate volume   Culture   Final    NO  GROWTH 2 DAYS Performed at [facility], [address], [place], [place] [address]    Report Status PENDING  Incomplete  Blood culture (routine x 2)     Status: None (Preliminary result)   Collection Time: 06/16/22 11:57 PM   Specimen: BLOOD  Result Value Ref Range Status   Specimen Description BLOOD LEFT ASSIST CONTROL  Final   Special Requests   Final    BOTTLES DRAWN AEROBIC AND ANAEROBIC Blood Culture results may not be optimal due to an excessive volume of blood received in culture bottles   Culture   Final    NO GROWTH 2 DAYS Performed at [facility], [address] [place], [place] [address]    Report Status PENDING  Incomplete  Aerobic Culture w Gram Stain (superficial specimen)     Status: None   Collection Time: 06/17/22  5:09 AM   Specimen: Abscess  Result Value Ref Range Status   Specimen Description   Final    ABSCESS Performed at [facility], [address] [place], [place] [address]    Special Requests   Final    NONE Performed at [facility], [address] [place], [place] [address]    Gram Stain   Final    FEW WBC PRESENT,BOTH PMN AND MONONUCLEAR RARE GRAM NEGATIVE COCCI IN PAIRS Performed at [facility], [place] [address] [place], [place] [address]    Culture   Final    MODERATE METHICILLIN RESISTANT STAPHYLOCOCCUS AUREUS   Report Status 06/19/2022 FINAL  Final   Organism ID, Bacteria METHICILLIN RESISTANT STAPHYLOCOCCUS AUREUS  Final      Susceptibility   Methicillin resistant staphylococcus aureus - MIC*    CIPROFLOXACIN >=8 RESISTANT Resistant     ERYTHROMYCIN >=8 RESISTANT Resistant     GENTAMICIN <=0.5 SENSITIVE Sensitive     OXACILLIN >=4 RESISTANT Resistant     TETRACYCLINE <=1 SENSITIVE Sensitive     VANCOMYCIN 1 SENSITIVE Sensitive     TRIMETH/SULFA <=10 SENSITIVE Sensitive     CLINDAMYCIN <=0.25 SENSITIVE Sensitive     RIFAMPIN <=0.5 SENSITIVE Sensitive     Inducible Clindamycin NEGATIVE  Sensitive     * MODERATE METHICILLIN RESISTANT STAPHYLOCOCCUS AUREUS         Radiology Studies: DG Lumbar Spine Complete  Result Date: 06/17/2022  CLINICAL DATA:  Larey Seat 3 days ago, with low back pain since then, and pain in the right hand middle finger after having removed a wooden splinter from the digit yesterday. EXAM: LUMBAR SPINE - COMPLETE 4+ VIEW; RIGHT MIDDLE FINGER 2+V COMPARISON:  None Available. FINDINGS: Right middle finger series, three views: There is moderate swelling in the long finger particularly proximally. No soft tissue gas or radiopaque foreign body is seen. The underlying bones are unremarkable without evidence of fracture, dislocation, destructive lesion or degenerative changes. There is normal bone mineralization. Lumbar spine: There is no evidence of lumbar spine fracture. Alignment is normal. Intervertebral disc spaces are maintained above L5, with mild disc space loss at L5-S1. There is early facet joint spurring from L3-4 down without appreciable foraminal stenosis. There is mild lumbar spondylosis. The SI joints are unremarkable, as visualized. There are intestinal surgical changes in the left lower abdomen as well as nearby clustered surgical clips. IMPRESSION: 1. Swelling in the middle finger without acute underlying bone abnormality, visible foreign body or soft tissue gas. 2. Mild degenerative changes of the lumbar spine without evidence of fractures. Electronically Signed   By: Almira Bar M.D.   On: 06/17/2022 00:57   DG Finger Middle Right  Result Date: 06/17/2022 CLINICAL DATA:  Larey Seat 3 days ago, with low back pain since then, and pain in the right hand middle finger after having removed a wooden splinter from the digit yesterday. EXAM: LUMBAR SPINE - COMPLETE 4+ VIEW; RIGHT MIDDLE FINGER 2+V COMPARISON:  None Available. FINDINGS: Right middle finger series, three views: There is moderate swelling in the long finger particularly proximally. No soft tissue gas or  radiopaque foreign body is seen. The underlying bones are unremarkable without evidence of fracture, dislocation, destructive lesion or degenerative changes. There is normal bone mineralization. Lumbar spine: There is no evidence of lumbar spine fracture. Alignment is normal. Intervertebral disc spaces are maintained above L5, with mild disc space loss at L5-S1. There is early facet joint spurring from L3-4 down without appreciable foraminal stenosis. There is mild lumbar spondylosis. The SI joints are unremarkable, as visualized. There are intestinal surgical changes in the left lower abdomen as well as nearby clustered surgical clips. IMPRESSION: 1. Swelling in the middle finger without acute underlying bone abnormality, visible foreign body or soft tissue gas. 2. Mild degenerative changes of the lumbar spine without evidence of fractures. Electronically Signed   By: Almira Bar M.D.   On: 06/17/2022 00:57            LOS: 2 days      Sunnie Nielsen, DO [facility] 06/19/2022, 2:26 PM   Staff may message me via secure chat in Epic  but this may not receive immediate response,  please page for urgent matters!  If 7PM-7AM, please contact night-coverage [email or web address]  Dictation software was used to generate the above note. Typos may occur and escape review, as with typed/written notes. Please contact Dr Lyn Hollingshead directly for clarity if needed.

## 2022-06-19 NOTE — Procedures (Signed)
12pm 06/18/22  Procedure note:  After informed consent, the right hand and long finger were sterilely prepped with alcohol and Betadine.  8 cc of 1% lidocaine was administered to the base of the long finger as a digital block.  After appropriate anesthesia was obtained, the 2 prior poke hole incisions along the radial and ulnar palmar aspect at the base of the long finger was opened further with an 11 blade scalpel.  Forceps were utilized to open and decompress the underlying soft tissue.  There was notable purulence which was expressed through both incisions.  Care was taken to explore palmarly radially and ulnarly to decompress any signs of abscess.  100 cc of irrigant was utilized to flush through the incisions.  Incisions were left open to drain further.  Xeroform and gauze were placed over the incisions.  Patient tolerated the procedure well.  He was instructed to continue 3 times daily soapy soaks and elevation.  Ross Marcus

## 2022-06-19 NOTE — Progress Notes (Signed)
Mobility Specialist - Progress Note    06/19/22 1024  Mobility  Activity Ambulated independently in hallway;Stood at bedside;Dangled on edge of bed  Level of Assistance Independent  Assistive Device None  Distance Ambulated (ft) 200 ft  Activity Response Tolerated well  $Mobility charge 1 Mobility    Pt supine in bed on RA upon arrival. Pt STS and ambulates 1 lap around NS indep. Pt returns to bed with needs in reach.   Terrilyn Saver  Mobility Specialist  06/19/22 10:25 AM

## 2022-06-20 ENCOUNTER — Other Ambulatory Visit: Payer: Self-pay

## 2022-06-20 LAB — BASIC METABOLIC PANEL
Anion gap: 4 — ABNORMAL LOW (ref 5–15)
BUN: 7 mg/dL (ref 6–20)
CO2: 26 mmol/L (ref 22–32)
Calcium: 8.2 mg/dL — ABNORMAL LOW (ref 8.9–10.3)
Chloride: 111 mmol/L (ref 98–111)
Creatinine, Ser: 0.81 mg/dL (ref 0.61–1.24)
GFR, Estimated: >60 mL/min (ref >60)
Glucose, Bld: 83 mg/dL (ref 70–99)
Potassium: 3.7 mmol/L (ref 3.5–5.1)
Sodium: 141 mmol/L (ref 135–145)

## 2022-06-20 MED ORDER — HYDROCODONE-ACETAMINOPHEN 5-325 MG PO TABS
1.0000 | ORAL_TABLET | Freq: Four times a day (QID) | ORAL | 0 refills | Status: AC | PRN
  Filled 2022-06-20: qty 30, 4d supply, fill #0

## 2022-06-20 MED ORDER — CHLORHEXIDINE GLUCONATE 4 % EX LIQD
Freq: Every day | CUTANEOUS | 0 refills | Status: AC | PRN
  Filled 2022-06-20: qty 118, fill #0

## 2022-06-20 MED ORDER — CLINDAMYCIN HCL 150 MG PO CAPS
300.0000 mg | ORAL_CAPSULE | Freq: Four times a day (QID) | ORAL | Status: DC
  Administered 2022-06-20: 300 mg via ORAL
  Filled 2022-06-20 (×2): qty 2

## 2022-06-20 MED ORDER — CLINDAMYCIN HCL 300 MG PO CAPS
300.0000 mg | ORAL_CAPSULE | Freq: Four times a day (QID) | ORAL | 0 refills | Status: AC
  Filled 2022-06-20: qty 20, 5d supply, fill #0

## 2022-06-20 NOTE — TOC Transition Note (Signed)
Transition of Care [facility]) - CM/SW Discharge Note   Patient Details  Name: NYAIRE DENBLEYKER MRN: [id number] Date of Birth: [birth year]  [facility]) CM/SW Contact:  Chapman Fitch, RN Phone Number: 06/20/2022, 1:48 PM   Clinical Narrative:      Per pharmacy  Cholohexidine will have to be ordered, Clinda $0.  Norco - $14.25 Patient will need to pick up after discharge, provide ID and copay.  Patient states when his girlfriend picks him up they will go to the pharmacy and pick up the medication.  Bedside RN notified        Patient Goals and CMS Choice        Discharge Placement                       Discharge Plan and Services                                     Social Determinants of Health (SDOH) Interventions     Readmission Risk Interventions     No data to display

## 2022-06-20 NOTE — Plan of Care (Signed)
  Problem: Clinical Measurements: Goal: Ability to avoid or minimize complications of infection will improve Outcome: Progressing   Problem: Skin Integrity: Goal: Skin integrity will improve Outcome: Progressing   Problem: Education: Goal: Knowledge of General Education information will improve Description: Including pain rating scale, medication(s)/side effects and non-pharmacologic comfort measures Outcome: Progressing   Problem: Health Behavior/Discharge Planning: Goal: Ability to manage health-related needs will improve Outcome: Progressing   Problem: Clinical Measurements: Goal: Ability to maintain clinical measurements within normal limits will improve Outcome: Progressing Goal: Will remain free from infection Outcome: Progressing Goal: Diagnostic test results will improve Outcome: Progressing Goal: Respiratory complications will improve Outcome: Progressing Goal: Cardiovascular complication will be avoided Outcome: Progressing   

## 2022-06-20 NOTE — Progress Notes (Signed)
Mobility Specialist - Progress Note    06/20/22 1400  Mobility  Activity Ambulated independently in hallway  Level of Assistance Independent  Assistive Device None  Distance Ambulated (ft) 320 ft  Activity Response Tolerated well  $Mobility charge 1 Mobility   Pt ambulates in hallway indep.  Terrilyn Saver  Mobility Specialist  06/20/22 2:01 PM

## 2022-06-20 NOTE — Progress Notes (Signed)
Mobility Specialist - Progress Note    06/20/22 0901  Mobility  Activity Ambulated independently in hallway;Stood at bedside;Dangled on edge of bed  Level of Assistance Independent  Distance Ambulated (ft) 640 ft  Activity Response Tolerated well  $Mobility charge 1 Mobility   Pt supine in bed on RA upon arrival. Pt sts and ambulates indep. Pt returns to bed with needs in reach.   Terrilyn Saver  Mobility Specialist  06/20/22 9:02 AM

## 2022-06-20 NOTE — TOC Initial Note (Signed)
Transition of Care [facility]) - Initial/Assessment Note    Patient Details  Name: ANTHEM FRAZER MRN: [id number] Date of Birth: [birth year]  Transition of Care [facility]) CM/SW Contact:    Chapman Fitch, RN Phone Number: 06/20/2022, 12:08 PM  Clinical Narrative:                    Admitted for: cellulitis of finger Admitted from:From home with girlfriend PCP: NA Pharmacy   NA: Current home health/prior home health/DME: NA  Patient states that he lives at home with his girlfriend.  She will be transporting at discharge.  Patient states that he is currently unemployed.  Typically "works or himself", but due to his chrons he has been limited on what he has been able to do.    Provided patient with application to [facility] n ad explained resources.   Pharmacy updated to [facility]) to obtain medications at discharge       Patient Goals and CMS Choice        Expected Discharge Plan and Services                                                Prior Living Arrangements/Services                       Activities of Daily Living Home Assistive Devices/Equipment: None ADL Screening (condition at time of admission) Patient's cognitive ability adequate to safely complete daily activities?: Yes Is the patient deaf or have difficulty hearing?: No Does the patient have difficulty seeing, even when wearing glasses/contacts?: No Does the patient have difficulty concentrating, remembering, or making decisions?: No Patient able to express need for assistance with ADLs?: Yes Does the patient have difficulty dressing or bathing?: No Independently performs ADLs?: Yes (appropriate for developmental age) Does the patient have difficulty walking or climbing stairs?: No Weakness of Legs: None Weakness of Arms/Hands: None  Permission Sought/Granted                  Emotional Assessment              Admission  diagnosis:  Cellulitis of right hand [L03.113] Cellulitis of hand, right [L03.113] Fall, initial encounter [W19.XXXA] Low back pain at multiple sites [M54.50] Patient Active Problem List   Diagnosis Date Noted   Cellulitis of right hand 06/17/2022   Crohn's disease (HCC) 06/17/2022   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 [place] [address] Phone: [phone] Fax: [phone]     Social Determinants of Health (SDOH) Interventions    Readmission Risk Interventions     No data to display

## 2022-06-20 NOTE — Progress Notes (Signed)
Patient discharged to home with no complications at this  time. AVS and medications reviewed with patient. Patient expressed full understanding. PIVX1 removed.

## 2022-06-20 NOTE — Plan of Care (Signed)
  Problem: Clinical Measurements: Goal: Ability to avoid or minimize complications of infection will improve 06/20/2022 1427 by Evelena Peat, RN Outcome: Completed/Met 06/20/2022 1010 by Evelena Peat, RN Outcome: Progressing   Problem: Skin Integrity: Goal: Skin integrity will improve 06/20/2022 1427 by Evelena Peat, RN Outcome: Completed/Met 06/20/2022 1010 by Evelena Peat, RN Outcome: Progressing   Problem: Education: Goal: Knowledge of General Education information will improve Description: Including pain rating scale, medication(s)/side effects and non-pharmacologic comfort measures 06/20/2022 1427 by Evelena Peat, RN Outcome: Completed/Met 06/20/2022 1010 by Evelena Peat, RN Outcome: Progressing   Problem: Health Behavior/Discharge Planning: Goal: Ability to manage health-related needs will improve 06/20/2022 1427 by Evelena Peat, RN Outcome: Completed/Met 06/20/2022 1010 by Evelena Peat, RN Outcome: Progressing   Problem: Clinical Measurements: Goal: Ability to maintain clinical measurements within normal limits will improve 06/20/2022 1427 by Evelena Peat, RN Outcome: Completed/Met 06/20/2022 1010 by Evelena Peat, RN Outcome: Progressing Goal: Will remain free from infection 06/20/2022 1427 by Evelena Peat, RN Outcome: Completed/Met 06/20/2022 1010 by Evelena Peat, RN Outcome: Progressing Goal: Diagnostic test results will improve 06/20/2022 1427 by Evelena Peat, RN Outcome: Completed/Met 06/20/2022 1010 by Evelena Peat, RN Outcome: Progressing Goal: Respiratory complications will improve 06/20/2022 1427 by Evelena Peat, RN Outcome: Completed/Met 06/20/2022 1010 by Evelena Peat, RN Outcome: Progressing Goal: Cardiovascular complication will be avoided 06/20/2022 1427 by Evelena Peat, RN Outcome: Completed/Met 06/20/2022 1010 by Evelena Peat, RN Outcome: Progressing    Problem: Activity: Goal: Risk for activity intolerance will decrease 06/20/2022 1427 by Evelena Peat, RN Outcome: Completed/Met 06/20/2022 1010 by Evelena Peat, RN Outcome: Progressing   Problem: Nutrition: Goal: Adequate nutrition will be maintained 06/20/2022 1427 by Evelena Peat, RN Outcome: Completed/Met 06/20/2022 1010 by Evelena Peat, RN Outcome: Progressing

## 2022-06-20 NOTE — Discharge Summary (Signed)
Physician Discharge Summary   Patient: Matthew Madden MRN: [id number]  DOB: 1976-10-13   Admit:     Date of Admission: 06/17/2022 Admitted from: home   Discharge: Date of discharge: 06/20/22 Disposition: Home Condition at discharge: good  CODE STATUS: FULL     Discharge Physician: Emeterio Reeve, DO [facility]     PCP: Patient, No Pcp Per  Recommendations for Outpatient Follow-up:  Follow up with PCP Patient, No Pcp Per in 2-4 weeks Please follow up on the following pending results: none PCP AND OTHER OUTPATIENT PROVIDERS: SEE [place] ADDITION TO GENERIC AVS PATIENT INFO    Discharge Instructions     Call MD for:  redness, tenderness, or signs of infection (pain, swelling, redness, odor or green/yellow discharge around incision site)   Complete by: As directed    Call MD for:  severe uncontrolled pain   Complete by: As directed    Call MD for:  temperature >100.4   Complete by: As directed    Diet - low sodium heart healthy   Complete by: As directed    Discharge instructions   Complete by: As directed    Keep wound clean/dry and covered. Soak/wash as directed at least three times per day - can use the hibiclens prescription or Dial/similar antibacterial soap at home. If worse, especially if red streaks in skin, fever, weakness or severe pain please return to the hospital!   Discharge wound care:   Complete by: As directed    Comments: Soak right hand in soap and water three times a day. Keep Hand elevated.   Increase activity slowly   Complete by: As directed           Hospital Course: Matthew Madden is a 46 y.o. male with medical history significant for Crohn's disease on Humira who presents to the ED late 06/16/2022 with pain and swelling of his right hand that started a few days after getting a splinter stuck on the palmar side of his third finger.  He denies fever or chills. 08/08:  In ED, incision and drainage performed, VSS, XR R hand (+) swelling third digit without acute underlying bony abnormality visible foreign body or soft tissue gas, mild degenerative changes.  ED spoke with Ortho, Dr. Sharlet Salina, recommending admission and IV antibiotics.  Patient started on vancomycin, ceftriaxone.  Hospitalist admitted.  Per orthopedics: Continue IV abx, elevation right hand, consider MRI if symptoms not improving over next 24 to 48 hours. 08/09: Ortho saw early a.m., expressed purulent material from one of the poke hole incisions from the ER I&D, in mid-day performed I&D at bedside  08/10: Ortho saw early a.m., erythema and fluctuance noted to be improving w/ incisions continuing to drain, recommend continued soapy soaks 2-3 times per day, and continued elevation. BCx NGx2d. Wound Cx growing staph aureus, susceptibilities have resulted, will start on p.o. abx and if continuing to improve into tomorrow should be okay for discharge, if worse will obtain MRI 08/11: swelling down, pain improved, no fever/chills, tolerating po abx and agreeable for discharge.    Consultants:  Orthopedics  Procedures:  I&D x2, once in ED 06/16/22 and once by orthopedics at bedside on 06/18/22      Discharge Diagnoses: Principal Problem:   Cellulitis of right hand Active Problems:   Crohn's disease ([place])   Immunosuppression due to drug therapy ([place])   Infected bite wound of hand, initial encounter  Assessment & Plan:  Cellulitis of right hand Infected puncture wound right hand Possible flexor tenosynovitis Vancomycin and Rocephin --> po Clindamycin Pain control Ortho following: Consider MRI if not improving  Crohn's disease ([place]) On Humira weekly  Immunosuppression due to drug therapy [facility]) Patient at high risk for infection      Discharge Instructions  Allergies as of 06/20/2022   Not on File      Medication List     TAKE these medications    chlorhexidine 4 %  external liquid Commonly known as: Hibiclens Apply topically daily as needed.   clindamycin 300 MG capsule Commonly known as: CLEOCIN Take 1 capsule (300 mg total) by mouth 4 (four) times daily for 5 days.   HYDROcodone-acetaminophen 5-325 MG tablet Commonly known as: NORCO/VICODIN Take 1-2 tablets by mouth every 6 (six) hours as needed for moderate pain.               Discharge Care Instructions  (From admission, onward)           Start     Ordered   06/20/22 0000  Discharge wound care:       Comments: Comments: Soak right hand in soap and water three times a day. Keep Hand elevated.   06/20/22 1310              Not on File   Subjective: Patient feels well today, no chest pain/shortness of breath, reports pain in finger is a bit improved from previous, swelling looks better.  Still some drainage of occasional bloody/serous fluid but no additional drainage of pus.   Discharge Exam: BP 131/88 (BP Location: Left Arm)   Pulse 64   Temp 97.7 F (36.5 C) (Oral)   Resp 19   Ht 5\' 10"  (1.778 m)   Wt 74.8 kg   SpO2 100%   BMI 23.68 kg/m  General: Pt is alert, awake, not in acute distress Cardiovascular: RRR, S1/S2 +, no rubs, no gallops Respiratory: CTA bilaterally, no wheezing, no rhonchi Abdominal: Soft, NT, ND, bowel sounds + Extremities: Right third digit bandage removed and examined.  Unable to express any purulent fluid but mild serosanguineous drainage from incision sites.  Appears less edematous than prior exams.     The results of significant diagnostics from this hospitalization (including imaging, microbiology, ancillary and laboratory) are listed below for reference.     Microbiology: Recent Results (from the past 240 hour(s))  Blood culture (routine x 2)     Status: None (Preliminary result)   Collection Time: 06/16/22 11:57 PM   Specimen: BLOOD  Result Value Ref Range Status   Specimen Description BLOOD RIGHT ASSIST CONTROL  Final    Special Requests   Final    BOTTLES DRAWN AEROBIC AND ANAEROBIC Blood Culture adequate volume   Culture   Final    NO GROWTH 3 DAYS Performed at [facility], [address], [place], [place] [address]    Report Status PENDING  Incomplete  Blood culture (routine x 2)     Status: None (Preliminary result)   Collection Time: 06/16/22 11:57 PM   Specimen: BLOOD  Result Value Ref Range Status   Specimen Description BLOOD LEFT ASSIST CONTROL  Final   Special Requests   Final    BOTTLES DRAWN AEROBIC AND ANAEROBIC Blood Culture results may not be optimal due to an excessive volume of blood received in culture bottles   Culture   Final    NO GROWTH 3 DAYS Performed  at [facility], [address] [place], [place] [address]    Report Status PENDING  Incomplete  Aerobic Culture w Gram Stain (superficial specimen)     Status: None   Collection Time: 06/17/22  5:09 AM   Specimen: Abscess  Result Value Ref Range Status   Specimen Description   Final    ABSCESS Performed at [facility], [address], [place], [place] [address]    Special Requests   Final    NONE Performed at [facility], [address] [place], [place] [address]    Gram Stain   Final    FEW WBC PRESENT,BOTH PMN AND MONONUCLEAR RARE GRAM NEGATIVE COCCI IN PAIRS Performed at [facility], [place] [address], [place], [place] [address]    Culture   Final    MODERATE METHICILLIN RESISTANT STAPHYLOCOCCUS AUREUS   Report Status 06/19/2022 FINAL  Final   Organism ID, Bacteria METHICILLIN RESISTANT STAPHYLOCOCCUS AUREUS  Final      Susceptibility   Methicillin resistant staphylococcus aureus - MIC*    CIPROFLOXACIN >=8 RESISTANT Resistant     ERYTHROMYCIN >=8 RESISTANT Resistant     GENTAMICIN <=0.5 SENSITIVE Sensitive     OXACILLIN >=4 RESISTANT Resistant     TETRACYCLINE <=1 SENSITIVE Sensitive     VANCOMYCIN 1 SENSITIVE Sensitive     TRIMETH/SULFA <=10 SENSITIVE Sensitive      CLINDAMYCIN <=0.25 SENSITIVE Sensitive     RIFAMPIN <=0.5 SENSITIVE Sensitive     Inducible Clindamycin NEGATIVE Sensitive     * MODERATE METHICILLIN RESISTANT STAPHYLOCOCCUS AUREUS     Labs: BNP (last 3 results) No results for input(s): "BNP" in the last 8760 hours. Basic Metabolic Panel: Recent Labs  Lab 06/16/22 2357 06/17/22 1027 06/20/22 0433  NA 138  --  141  K 3.2*  --  3.7  CL 106  --  111  CO2 23  --  26  GLUCOSE 139*  --  83  BUN 16  --  7  CREATININE 0.95 0.69 0.81  CALCIUM 8.9  --  8.2*  MG 2.1  --   --    Liver Function Tests: Recent Labs  Lab 06/16/22 2357  AST 22  ALT 12  ALKPHOS 63  BILITOT 0.5  PROT 7.2  ALBUMIN 4.1   No results for input(s): "LIPASE", "AMYLASE" in the last 168 hours. No results for input(s): "AMMONIA" in the last 168 hours. CBC: Recent Labs  Lab 06/17/22 0509 06/19/22 0448  WBC 10.9* 8.2  NEUTROABS 7.2  --   HGB 12.6* 12.3*  HCT 38.4* 36.3*  MCV 97.0 95.3  PLT 248 246   Cardiac Enzymes: No results for input(s): "CKTOTAL", "CKMB", "CKMBINDEX", "TROPONINI" in the last 168 hours. BNP: Invalid input(s): "POCBNP" CBG: No results for input(s): "GLUCAP" in the last 168 hours. D-Dimer No results for input(s): "DDIMER" in the last 72 hours. Hgb A1c No results for input(s): "HGBA1C" in the last 72 hours. Lipid Profile No results for input(s): "CHOL", "HDL", "LDLCALC", "TRIG", "CHOLHDL", "LDLDIRECT" in the last 72 hours. Thyroid function studies No results for input(s): "TSH", "T4TOTAL", "T3FREE", "THYROIDAB" in the last 72 hours.  Invalid input(s): "FREET3" Anemia work up No results for input(s): "VITAMINB12", "FOLATE", "FERRITIN", "TIBC", "IRON", "RETICCTPCT" in the last 72 hours. Urinalysis    Component Value Date/Time   COLORURINE Yellow 08/18/2012 1943   APPEARANCEUR Hazy 08/18/2012 1943   LABSPEC 1.024 08/18/2012 1943   PHURINE 6.0 08/18/2012 1943   GLUCOSEU Negative 08/18/2012  1943   HGBUR Negative  08/18/2012 1943   BILIRUBINUR Negative 08/18/2012 1943   KETONESUR Negative 08/18/2012 1943   PROTEINUR Negative 08/18/2012 1943   NITRITE Negative 08/18/2012 1943   LEUKOCYTESUR Negative 08/18/2012 1943   Sepsis Labs Recent Labs  Lab 06/17/22 0509 06/19/22 0448  WBC 10.9* 8.2   Microbiology Recent Results (from the past 240 hour(s))  Blood culture (routine x 2)     Status: None (Preliminary result)   Collection Time: 06/16/22 11:57 PM   Specimen: BLOOD  Result Value Ref Range Status   Specimen Description BLOOD RIGHT ASSIST CONTROL  Final   Special Requests   Final    BOTTLES DRAWN AEROBIC AND ANAEROBIC Blood Culture adequate volume   Culture   Final    NO GROWTH 3 DAYS Performed at [facility], [address], [place], [place] [address]    Report Status PENDING  Incomplete  Blood culture (routine x 2)     Status: None (Preliminary result)   Collection Time: 06/16/22 11:57 PM   Specimen: BLOOD  Result Value Ref Range Status   Specimen Description BLOOD LEFT ASSIST CONTROL  Final   Special Requests   Final    BOTTLES DRAWN AEROBIC AND ANAEROBIC Blood Culture results may not be optimal due to an excessive volume of blood received in culture bottles   Culture   Final    NO GROWTH 3 DAYS Performed at [facility], [address], [place], [place] [address]    Report Status PENDING  Incomplete  Aerobic Culture w Gram Stain (superficial specimen)     Status: None   Collection Time: 06/17/22  5:09 AM   Specimen: Abscess  Result Value Ref Range Status   Specimen Description   Final    ABSCESS Performed at [facility], [address], [place], [place] [address]    Special Requests   Final    NONE Performed at [facility], [address] [place], [place] [address]    Gram Stain   Final    FEW WBC PRESENT,BOTH PMN AND MONONUCLEAR RARE GRAM NEGATIVE COCCI IN PAIRS Performed at [facility], [address] [place],  [place] [address]    Culture   Final    MODERATE METHICILLIN RESISTANT STAPHYLOCOCCUS AUREUS   Report Status 06/19/2022 FINAL  Final   Organism ID, Bacteria METHICILLIN RESISTANT STAPHYLOCOCCUS AUREUS  Final      Susceptibility   Methicillin resistant staphylococcus aureus - MIC*    CIPROFLOXACIN >=8 RESISTANT Resistant     ERYTHROMYCIN >=8 RESISTANT Resistant     GENTAMICIN <=0.5 SENSITIVE Sensitive     OXACILLIN >=4 RESISTANT Resistant     TETRACYCLINE <=1 SENSITIVE Sensitive     VANCOMYCIN 1 SENSITIVE Sensitive     TRIMETH/SULFA <=10 SENSITIVE Sensitive     CLINDAMYCIN <=0.25 SENSITIVE Sensitive     RIFAMPIN <=0.5 SENSITIVE Sensitive     Inducible Clindamycin NEGATIVE Sensitive     * MODERATE METHICILLIN RESISTANT STAPHYLOCOCCUS AUREUS   Imaging DG Lumbar Spine Complete  Result Date: 06/17/2022 CLINICAL DATA:  Larey Seat 3 days ago, with low back pain since then, and pain in the right hand middle finger after having removed a wooden splinter from the digit yesterday. EXAM: LUMBAR SPINE - COMPLETE 4+ VIEW; RIGHT MIDDLE FINGER 2+V COMPARISON:  None Available. FINDINGS: Right middle finger series, three views: There is moderate swelling in the long finger particularly proximally. No soft tissue gas or radiopaque foreign body is  seen. The underlying bones are unremarkable without evidence of fracture, dislocation, destructive lesion or degenerative changes. There is normal bone mineralization. Lumbar spine: There is no evidence of lumbar spine fracture. Alignment is normal. Intervertebral disc spaces are maintained above L5, with mild disc space loss at L5-S1. There is early facet joint spurring from L3-4 down without appreciable foraminal stenosis. There is mild lumbar spondylosis. The SI joints are unremarkable, as visualized. There are intestinal surgical changes in the left lower abdomen as well as nearby clustered surgical clips. IMPRESSION: 1. Swelling in the middle finger without acute underlying bone  abnormality, visible foreign body or soft tissue gas. 2. Mild degenerative changes of the lumbar spine without evidence of fractures. Electronically Signed   By: Almira Bar M.D.   On: 06/17/2022 00:57   DG Finger Middle Right  Result Date: 06/17/2022 CLINICAL DATA:  Larey Seat 3 days ago, with low back pain since then, and pain in the right hand middle finger after having removed a wooden splinter from the digit yesterday. EXAM: LUMBAR SPINE - COMPLETE 4+ VIEW; RIGHT MIDDLE FINGER 2+V COMPARISON:  None Available. FINDINGS: Right middle finger series, three views: There is moderate swelling in the long finger particularly proximally. No soft tissue gas or radiopaque foreign body is seen. The underlying bones are unremarkable without evidence of fracture, dislocation, destructive lesion or degenerative changes. There is normal bone mineralization. Lumbar spine: There is no evidence of lumbar spine fracture. Alignment is normal. Intervertebral disc spaces are maintained above L5, with mild disc space loss at L5-S1. There is early facet joint spurring from L3-4 down without appreciable foraminal stenosis. There is mild lumbar spondylosis. The SI joints are unremarkable, as visualized. There are intestinal surgical changes in the left lower abdomen as well as nearby clustered surgical clips. IMPRESSION: 1. Swelling in the middle finger without acute underlying bone abnormality, visible foreign body or soft tissue gas. 2. Mild degenerative changes of the lumbar spine without evidence of fractures. Electronically Signed   By: Almira Bar M.D.   On: 06/17/2022 00:57      Time coordinating discharge: Over 30 minutes  SIGNED:  Sunnie Nielsen DO [facility]

## 2022-06-22 LAB — CULTURE, BLOOD (ROUTINE X 2)
Culture: NO GROWTH
Culture: NO GROWTH
Special Requests: ADEQUATE

## 2022-07-11 ENCOUNTER — Other Ambulatory Visit (HOSPITAL_BASED_OUTPATIENT_CLINIC_OR_DEPARTMENT_OTHER): Payer: Self-pay

## 2022-08-08 ENCOUNTER — Other Ambulatory Visit: Payer: Self-pay
